# Patient Record
Sex: Female | Born: 1970 | Race: White | Hispanic: No | Marital: Married | Smoking: Never smoker
Health system: Southern US, Community
[De-identification: ages and names within clinical notes are randomized; demographics above are authoritative.]

## PROBLEM LIST (undated history)

## (undated) ENCOUNTER — Emergency Department (HOSPITAL_COMMUNITY): Admission: EM | Discharge status: Against Medical Advice | Payer: Self-pay | Attending: *Deleted | Admitting: *Deleted

## (undated) DIAGNOSIS — N393 Stress incontinence (female) (male): Secondary | ICD-10-CM

## (undated) DIAGNOSIS — F329 Major depressive disorder, single episode, unspecified: Secondary | ICD-10-CM

## (undated) DIAGNOSIS — R51 Headache: Secondary | ICD-10-CM

## (undated) DIAGNOSIS — F419 Anxiety disorder, unspecified: Secondary | ICD-10-CM

## (undated) DIAGNOSIS — N86 Erosion and ectropion of cervix uteri: Secondary | ICD-10-CM

## (undated) DIAGNOSIS — N62 Hypertrophy of breast: Secondary | ICD-10-CM

## (undated) DIAGNOSIS — R05 Cough: Secondary | ICD-10-CM

## (undated) DIAGNOSIS — N930 Postcoital and contact bleeding: Secondary | ICD-10-CM

## (undated) DIAGNOSIS — Z309 Encounter for contraceptive management, unspecified: Secondary | ICD-10-CM

## (undated) DIAGNOSIS — M5430 Sciatica, unspecified side: Secondary | ICD-10-CM

## (undated) DIAGNOSIS — Z9889 Other specified postprocedural states: Secondary | ICD-10-CM

## (undated) DIAGNOSIS — F32A Depression, unspecified: Secondary | ICD-10-CM

## (undated) DIAGNOSIS — N63 Unspecified lump in unspecified breast: Principal | ICD-10-CM

## (undated) DIAGNOSIS — R87629 Unspecified abnormal cytological findings in specimens from vagina: Secondary | ICD-10-CM

## (undated) DIAGNOSIS — I10 Essential (primary) hypertension: Secondary | ICD-10-CM

## (undated) DIAGNOSIS — Z9109 Other allergy status, other than to drugs and biological substances: Secondary | ICD-10-CM

## (undated) HISTORY — DX: Other allergy status, other than to drugs and biological substances: Z91.09

## (undated) HISTORY — DX: Cough: R05

## (undated) HISTORY — DX: Essential (primary) hypertension: I10

## (undated) HISTORY — DX: Unspecified lump in unspecified breast: N63.0

## (undated) HISTORY — DX: Postcoital and contact bleeding: N93.0

## (undated) HISTORY — DX: Erosion and ectropion of cervix uteri: N86

## (undated) HISTORY — PX: CHOLECYSTECTOMY: SHX55

## (undated) HISTORY — DX: Stress incontinence (female) (male): N39.3

## (undated) HISTORY — DX: Unspecified abnormal cytological findings in specimens from vagina: R87.629

## (undated) HISTORY — DX: Encounter for contraceptive management, unspecified: Z30.9

---

## 2001-05-18 ENCOUNTER — Emergency Department (HOSPITAL_COMMUNITY): Admission: EM | Admit: 2001-05-18 | Discharge: 2001-05-18 | Payer: Self-pay | Admitting: Internal Medicine

## 2001-07-18 ENCOUNTER — Ambulatory Visit (HOSPITAL_COMMUNITY): Admission: RE | Admit: 2001-07-18 | Discharge: 2001-07-18 | Payer: Self-pay | Admitting: Obstetrics and Gynecology

## 2001-07-18 ENCOUNTER — Encounter: Payer: Self-pay | Admitting: Obstetrics and Gynecology

## 2001-07-18 ENCOUNTER — Other Ambulatory Visit: Admission: RE | Admit: 2001-07-18 | Discharge: 2001-07-18 | Payer: Self-pay | Admitting: Obstetrics and Gynecology

## 2002-01-05 ENCOUNTER — Emergency Department (HOSPITAL_COMMUNITY): Admission: EM | Admit: 2002-01-05 | Discharge: 2002-01-06 | Payer: Self-pay

## 2002-05-04 ENCOUNTER — Ambulatory Visit (HOSPITAL_COMMUNITY): Admission: RE | Admit: 2002-05-04 | Discharge: 2002-05-04 | Payer: Self-pay | Admitting: Pulmonary Disease

## 2002-06-24 ENCOUNTER — Encounter: Admission: RE | Admit: 2002-06-24 | Discharge: 2002-07-24 | Payer: Self-pay

## 2002-11-27 ENCOUNTER — Ambulatory Visit (HOSPITAL_COMMUNITY): Admission: RE | Admit: 2002-11-27 | Discharge: 2002-11-27 | Payer: Self-pay | Admitting: Internal Medicine

## 2002-11-27 ENCOUNTER — Encounter (INDEPENDENT_AMBULATORY_CARE_PROVIDER_SITE_OTHER): Payer: Self-pay | Admitting: Internal Medicine

## 2002-12-07 ENCOUNTER — Encounter (HOSPITAL_COMMUNITY): Admission: RE | Admit: 2002-12-07 | Discharge: 2003-01-06 | Payer: Self-pay | Admitting: Internal Medicine

## 2002-12-07 ENCOUNTER — Encounter (INDEPENDENT_AMBULATORY_CARE_PROVIDER_SITE_OTHER): Payer: Self-pay | Admitting: Internal Medicine

## 2003-11-13 ENCOUNTER — Emergency Department (HOSPITAL_COMMUNITY): Admission: EM | Admit: 2003-11-13 | Discharge: 2003-11-13 | Payer: Self-pay | Admitting: Emergency Medicine

## 2004-02-24 ENCOUNTER — Ambulatory Visit (HOSPITAL_COMMUNITY): Admission: RE | Admit: 2004-02-24 | Discharge: 2004-02-24 | Payer: Self-pay | Admitting: Obstetrics and Gynecology

## 2004-02-24 ENCOUNTER — Encounter: Payer: Self-pay | Admitting: Orthopedic Surgery

## 2004-09-20 ENCOUNTER — Emergency Department (HOSPITAL_COMMUNITY): Admission: EM | Admit: 2004-09-20 | Discharge: 2004-09-20 | Payer: Self-pay | Admitting: Emergency Medicine

## 2005-04-11 ENCOUNTER — Ambulatory Visit (HOSPITAL_COMMUNITY): Admission: RE | Admit: 2005-04-11 | Discharge: 2005-04-11 | Payer: Self-pay | Admitting: Obstetrics and Gynecology

## 2006-12-20 ENCOUNTER — Encounter: Admission: RE | Admit: 2006-12-20 | Discharge: 2006-12-20 | Payer: Self-pay | Admitting: Obstetrics and Gynecology

## 2007-05-26 ENCOUNTER — Other Ambulatory Visit: Admission: RE | Admit: 2007-05-26 | Discharge: 2007-05-26 | Payer: Self-pay | Admitting: Obstetrics and Gynecology

## 2007-05-30 ENCOUNTER — Ambulatory Visit (HOSPITAL_COMMUNITY): Admission: RE | Admit: 2007-05-30 | Discharge: 2007-05-30 | Payer: Self-pay | Admitting: Obstetrics & Gynecology

## 2008-07-24 ENCOUNTER — Emergency Department (HOSPITAL_COMMUNITY): Admission: EM | Admit: 2008-07-24 | Discharge: 2008-07-24 | Payer: Self-pay | Admitting: Emergency Medicine

## 2008-12-09 ENCOUNTER — Other Ambulatory Visit: Admission: RE | Admit: 2008-12-09 | Discharge: 2008-12-09 | Payer: Self-pay | Admitting: Obstetrics and Gynecology

## 2010-01-27 ENCOUNTER — Other Ambulatory Visit: Admission: RE | Admit: 2010-01-27 | Discharge: 2010-01-27 | Payer: Self-pay | Admitting: Obstetrics and Gynecology

## 2010-02-01 ENCOUNTER — Ambulatory Visit (HOSPITAL_COMMUNITY): Admission: RE | Admit: 2010-02-01 | Discharge: 2010-02-01 | Payer: Self-pay | Admitting: Obstetrics & Gynecology

## 2010-03-15 ENCOUNTER — Ambulatory Visit: Payer: Self-pay | Admitting: Orthopedic Surgery

## 2010-03-15 DIAGNOSIS — M5126 Other intervertebral disc displacement, lumbar region: Secondary | ICD-10-CM | POA: Insufficient documentation

## 2010-03-15 DIAGNOSIS — M25559 Pain in unspecified hip: Secondary | ICD-10-CM | POA: Insufficient documentation

## 2010-03-17 ENCOUNTER — Encounter (INDEPENDENT_AMBULATORY_CARE_PROVIDER_SITE_OTHER): Payer: Self-pay | Admitting: *Deleted

## 2010-03-20 ENCOUNTER — Ambulatory Visit (HOSPITAL_COMMUNITY): Admission: RE | Admit: 2010-03-20 | Discharge: 2010-03-20 | Payer: Self-pay | Admitting: Orthopedic Surgery

## 2010-03-28 ENCOUNTER — Encounter (INDEPENDENT_AMBULATORY_CARE_PROVIDER_SITE_OTHER): Payer: Self-pay | Admitting: *Deleted

## 2010-03-28 DIAGNOSIS — IMO0002 Reserved for concepts with insufficient information to code with codable children: Secondary | ICD-10-CM | POA: Insufficient documentation

## 2010-03-31 ENCOUNTER — Telehealth: Payer: Self-pay | Admitting: Orthopedic Surgery

## 2010-04-04 ENCOUNTER — Encounter: Payer: Self-pay | Admitting: Orthopedic Surgery

## 2010-04-17 ENCOUNTER — Ambulatory Visit: Payer: Self-pay | Admitting: Orthopedic Surgery

## 2010-04-17 DIAGNOSIS — G5790 Unspecified mononeuropathy of unspecified lower limb: Secondary | ICD-10-CM | POA: Insufficient documentation

## 2010-04-22 ENCOUNTER — Emergency Department (HOSPITAL_COMMUNITY): Admission: EM | Admit: 2010-04-22 | Discharge: 2010-04-22 | Payer: Self-pay | Admitting: Emergency Medicine

## 2010-04-28 ENCOUNTER — Ambulatory Visit (HOSPITAL_COMMUNITY): Admission: RE | Admit: 2010-04-28 | Discharge: 2010-04-29 | Payer: Self-pay | Admitting: Orthopaedic Surgery

## 2010-04-28 HISTORY — PX: ORIF DISTAL RADIUS FRACTURE: SUR927

## 2010-05-31 ENCOUNTER — Ambulatory Visit: Payer: Self-pay | Admitting: Orthopedic Surgery

## 2010-06-12 ENCOUNTER — Encounter (HOSPITAL_COMMUNITY): Admission: RE | Admit: 2010-06-12 | Discharge: 2010-07-12 | Payer: Self-pay | Admitting: Orthopaedic Surgery

## 2010-07-17 ENCOUNTER — Encounter (HOSPITAL_COMMUNITY)
Admission: RE | Admit: 2010-07-17 | Discharge: 2010-08-16 | Payer: Self-pay | Source: Home / Self Care | Admitting: Orthopaedic Surgery

## 2010-08-30 ENCOUNTER — Ambulatory Visit: Payer: Self-pay | Admitting: Orthopedic Surgery

## 2010-09-05 ENCOUNTER — Encounter: Payer: Self-pay | Admitting: Orthopedic Surgery

## 2010-09-19 ENCOUNTER — Ambulatory Visit
Admission: RE | Admit: 2010-09-19 | Discharge: 2010-09-19 | Payer: Self-pay | Source: Home / Self Care | Attending: Orthopedic Surgery | Admitting: Orthopedic Surgery

## 2010-10-10 NOTE — Miscellaneous (Signed)
Summary: ncs lowers root impingement order  Clinical Lists Changes  Problems: Added new problem of THORACIC/LUMBOSACRAL NEURITIS/RADICULITIS UNSPEC (ICD-724.4) Orders: Added new Referral order of Neurology Referral (Neuro) - Signed

## 2010-10-10 NOTE — Assessment & Plan Note (Signed)
Summary: ncs results scanned in chart/medcost/bsf   Visit Type:  Follow-up Referring Santiel Topper:  Pa Cyril Mourning, [facility] Primary Natalin Bible:  Dr. Juanetta Gosling  CC:  back pain.  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  She continues to have some discomfort in her back when she sits for a long time it seems to get better over the weekend when she is at home.  She did take a steroid Dosepak which almost gave her 100% relief.  She is also on Robaxin.  She is here today to discuss her nerve study findings and, with a plan for her treatment  She tells me that her symptoms are almost 100% better she still has a little bit of discomfort when sitting.    Meds: Effexor, Topamax, Imitrex, Aleve, Ibuprofen, naprosyn OTC some relief not much.  after review of the study I think she has the superficial peroneal nerve palsy most likely from something going on in her back despite the negative MRI I do not think the palsy or root impingement is at the knee area  Recommend gabapentin 100 mg at night for 6 weeks followup 6 weeks she is to call me if her symptoms start to come back so we can reduce the prednisone and increase the Neurontin     Allergies: No Known Drug Allergies  Past History:  Past Medical History: Last updated: 03/15/2010 MIGRAINES depression  Review of Systems Musculoskeletal:  back pain slightly to moderately improved.   Impression & Recommendations:  Problem # 1:  MONONEURITIS, LEG (ICD-355.8) Assessment Comment Only  superficial peroneal nerve LEFT shows disease and absent flow in the sensory mode  Orders: Est. Patient Level III (19147)  Medications Added to Medication List This Visit: 1)  Gabapentin 100 Mg Caps (Gabapentin) .Marland Kitchen.. 1 by mouth at  bedtime  Patient Instructions: 1)  start neurontin 100mg  at night x 6 weeks  2)  re-check in 6 weeks  Prescriptions: GABAPENTIN 100 MG CAPS (GABAPENTIN) 1 by mouth at bedtime  #42 x 1   Entered and Authorized by:   Fuller Canada MD   Signed by:   Fuller Canada MD on 04/17/2010   Method used:   Faxed to ...       [facility] [address] [phone]* (retail)       [address], [place]  [address]       Ph: [phone]       Fax: [phone]   RxID:   [id number]

## 2010-10-10 NOTE — Letter (Signed)
Summary: History form  History form   Imported By: Jacklynn Ganong 03/22/2010 11:23:10  _____________________________________________________________________  External Attachment:    Type:   Image     Comment:   External Document

## 2010-10-10 NOTE — Assessment & Plan Note (Signed)
Summary: EVAL/TREAT HIP PAIN LT>RT/NEEDS XRAYS/REF J.GRIFFIN/MEDCOST/CAF   Vital Signs:  Patient profile:   40 year old female Height:      62 inches Weight:      140 pounds Pulse rate:   64 / minute Resp:     16 per minute  Vitals Entered By: Fuller Canada MD (March 15, 2010 3:53 PM)  Visit Type:  new patient Referring Provider:  Pa Cyril Cox, [facility] Primary Provider:  Dr. Juanetta Cox  CC:  bilateral leg pain.  History of Present Illness: I saw Erica Cox in the office today for an initial visit.  She is a 40 years old woman with the complaint of:  bilateral leg pain, mostly back of left leg thigh area.  FYI 2005 MRI T and L spine for review.  Xrays today in our office.  Meds: Effexor, Topamax, Imitrex, Aleve, Ibuprofen, naprosyn OTC some relief not much.   Our patient today complains of bilateral leg pain LEFT greater than RIGHT with pain in the backs of her legs radiating from her back down her thighs.  She is having a dull throbbing burning sensation which reaches a level of 10 and it is constant.  It is worse when she is sitting in awkward positions and is better with ice and massage.  The pain occurs whether she sitting standing or walking came on gradually over the last few years and she does report some numbness tingling catching.  She did have an MRI in 2005 and it showed that she had normal lumbar and thoracic spine.  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 2 yrs of college.  Review of Systems Constitutional:   Complains of weight gain; denies weight loss, fever, chills, and fatigue. Cardiovascular:  Complains of chest pain; denies palpitations, fainting, and murmurs. Neurologic:  Complains of numbness and tingling; denies unsteady gait, dizziness, tremors, and seizure. Musculoskeletal:  Complains of joint pain, stiffness, and muscle pain; denies swelling, instability, redness, and heat.  The review of systems is negative for Respiratory, Gastrointestinal, Genitourinary, Endocrine, Psychiatric, Skin, HEENT, Immunology, and Hemoatologic.  Physical Exam  Msk:  The patient is well developed and nourished, with normal grooming and hygiene. The body habitus is  normal Pulses:  pulses normal in all 4 extremities Extremities:  no clubbing, cyanosis, edema, or deformity noted with normal full range of motion of all joints Neurologic:  The coordination and The reflexes were normal    When comparing sharp touch RIGHT and LEFT, the LEFT L5 dermatome had decreased sensation Skin:  intact without lesions or rashes Inguinal Nodes:  no significant adenopathy Psych:  alert and cooperative; normal mood and affect; normal attention span and concentration Additional Exam:  lumbar spine  His normal flexion of her lumbar spine shows normal extension rotation and lateral bend however she has tenderness in the SI joints and at the L5-S1 midline interspace  She has negative straight leg raise on the RIGHT buttock positive one on the LEFT at 45  She has also abnormal spinal contour in the sagittal plane with a RIGHT superior  ilium with normal leg length measuring 33 inches from ASIS with abnormal contour in the coronal plane as well.     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

## 2010-10-10 NOTE — Assessment & Plan Note (Signed)
Summary: 6 WK RE-CK/RESP TO MED/UMR/CAF   Visit Type:  Follow-up Referring Provider:  Pa Cyril Mourning, [facility] Primary Provider:  Dr. Juanetta Gosling  CC:  recheck in leg pain.  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   She is on Neurontin 100 mg at night or once a day  She is doing much better.  This is her 6 week recheck.No pain  She has also had a LEFT distal radius open treatment internal fixation for a severe fracture was major displacement.  That was done in [place].  She will follow there for that.    Allergies: No Known Drug Allergies   Impression & Recommendations:  Problem # 1:  MONONEURITIS, LEG (ICD-355.8)  continue Neurontin follow up in December for recheck  Orders: Est. Patient Level II (04540)  Patient Instructions: 1)  Stay on 1 neurontin at night  2)  return in December  Prescriptions: GABAPENTIN 100 MG CAPS (GABAPENTIN) 1 by mouth at bedtime  #90 x 0   Entered and Authorized by:   Fuller Canada MD   Signed by:   Fuller Canada MD on 05/31/2010   Method used:   Faxed to ...       [facility] [address] [phone]* (retail)       [address], [place]  [address]       Ph: [phone]       Fax: [phone]   RxID:   [id number]

## 2010-10-10 NOTE — Medication Information (Signed)
Summary: RX Folder  RX Folder   Imported By: Cammie Sickle 03/15/2010 18:16:06  _____________________________________________________________________  External Attachment:    Type:   Image     Comment:   External Document

## 2010-10-10 NOTE — Progress Notes (Signed)
Summary: Call to patient Referral appointment Dr Gerilyn Pilgrim  Phone Note Outgoing Call   Call placed to: Patient Summary of Call: I called patient, left msg at work# [phone] to notify of referral appointment rec'd in fax from Dr Gerilyn Pilgrim for [facility] - 04/04/10 at 10:30am Initial call taken by: Cammie Sickle,  March 31, 2010 11:20 AM  Follow-up for Phone Call        Patient ret'd call - she's already had the appointment w/Dr Gerilyn Pilgrim. Report to follow. Follow-up by: Cammie Sickle,  April 03, 2010 5:32 PM

## 2010-10-10 NOTE — Miscellaneous (Signed)
Summary: faxed ncs order to [place]  Clinical Lists Changes 

## 2010-10-10 NOTE — Miscellaneous (Signed)
Summary: mri L spine 03/20/10 [facility] 430pm  Clinical Lists Changes  o precert needed for [facility] preferred, [facility], to get results called to her.

## 2010-10-12 NOTE — Letter (Signed)
Summary: *Orthopedic No Show Letter  [facility]  [address]  [place], [place] [address]   Phone: [phone]  Fax: [phone]     09/05/2010  Erica Cox [address] [place], [place]  [address]    Dear Erica Cox,   Our records indicate that you missed your scheduled appointment with Dr. Beaulah Corin on 08/30/10.  Please contact this office to reschedule your appointment as soon as possible.  It is important that you keep your scheduled appointments with your physician, so we can provide you the best care possible.        Sincerely,    Dr. Terrance Mass, MD [facility] Phone [phone]

## 2010-10-12 NOTE — Assessment & Plan Note (Signed)
Summary: 3 MTH RECK/RESP MED/[facility]ST/[facility]   Visit Type:  Follow-up Referring Provider:  Pa Cyril Mourning, [facility] Primary Provider:  Dr. Juanetta Gosling  CC:  RECHECK HIP AND BACK.  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  She is on Neurontin 100 mg at night or once a day  she was last seen 3 months ago. She is doing well as long as she takes 100 mg of Neurontin at night.  On examination, she has negative straight leg raises bilaterally. There is no tenderness in the proximal portion of her LEFT leg, just around the fibular head and somewhat in the lateral portion of the calf.    Allergies: No Known Drug Allergies   Impression & Recommendations:  Problem # 1:  MONONEURITIS, LEG (ICD-355.8) Assessment Improved  Orders: Est. Patient Level II (16109)  Patient Instructions: 1)  Continue 100 mg neurontin hs  2)  f/u 6 months if needed   Orders Added: 1)  Est. Patient Level II [60454]

## 2010-11-24 LAB — PROTIME-INR
INR: 0.99 (ref 0.00–1.49)
Prothrombin Time: 13.3 seconds (ref 11.6–15.2)

## 2010-11-24 LAB — SURGICAL PCR SCREEN: MRSA, PCR: NEGATIVE

## 2011-03-20 DIAGNOSIS — Z5309 Procedure and treatment not carried out because of other contraindication: Secondary | ICD-10-CM | POA: Insufficient documentation

## 2011-03-26 DIAGNOSIS — Z5320 Procedure and treatment not carried out because of patient's decision for unspecified reasons: Secondary | ICD-10-CM | POA: Insufficient documentation

## 2011-07-01 ENCOUNTER — Encounter: Payer: Self-pay | Admitting: Emergency Medicine

## 2011-07-01 ENCOUNTER — Emergency Department (HOSPITAL_COMMUNITY)
Admission: EM | Admit: 2011-07-01 | Discharge: 2011-07-01 | Disposition: A | Discharge status: Home / Self Care | Payer: PRIVATE HEALTH INSURANCE | Attending: Emergency Medicine | Admitting: Emergency Medicine

## 2011-07-01 DIAGNOSIS — G43909 Migraine, unspecified, not intractable, without status migrainosus: Secondary | ICD-10-CM

## 2011-07-01 MED ORDER — SODIUM CHLORIDE 0.9 % IV BOLUS (SEPSIS)
1000.0000 mL | Freq: Once | INTRAVENOUS | Status: AC
  Administered 2011-07-01: 1000 mL via INTRAVENOUS

## 2011-07-01 MED ORDER — KETOROLAC TROMETHAMINE 30 MG/ML IJ SOLN
30.0000 mg | Freq: Once | INTRAMUSCULAR | Status: AC
  Administered 2011-07-01: 30 mg via INTRAVENOUS
  Filled 2011-07-01: qty 1

## 2011-07-01 MED ORDER — METOCLOPRAMIDE HCL 5 MG/ML IJ SOLN
10.0000 mg | Freq: Once | INTRAMUSCULAR | Status: AC
  Administered 2011-07-01: 10 mg via INTRAVENOUS
  Filled 2011-07-01: qty 2

## 2011-07-01 MED ORDER — DIPHENHYDRAMINE HCL 50 MG/ML IJ SOLN
25.0000 mg | Freq: Once | INTRAMUSCULAR | Status: AC
  Administered 2011-07-01: 18:00:00 via INTRAVENOUS
  Filled 2011-07-01: qty 4

## 2011-07-01 MED ORDER — SUMATRIPTAN-NAPROXEN SODIUM 85-500 MG PO TABS: 1.0000 | ORAL_TABLET | ORAL | Status: DC | PRN

## 2011-07-01 NOTE — ED Notes (Signed)
Patient c/o migraine x4 days with nausea. Sensitivity to light and sound. Per patient second migraine this month.

## 2011-07-01 NOTE — ED Provider Notes (Signed)
Scribed for Donnetta Hutching, MD, the patient was seen in room APA07/APA07 . This chart was scribed by Ellie Lunch.   CSN: [id number] Arrival date & time: 07/01/2011  4:30 PM   First MD Initiated Contact with Patient 07/01/11 1630      Chief Complaint  Patient presents with  . Migraine    (Consider location/radiation/quality/duration/timing/severity/associated sxs/prior treatment) HPI Erica Cox is a 40 y.o. female with a history of migraines who presents to the Emergency Department complaining of migraine for 4 days with associated nausea and photophobia. Pain was originally located in left periorbital but has migrated to right periorbital and now center forehead. Pain is made worse by light and sound. Pain is described as severe, constant, throbbing pain. Pt reports she saw Dr Mat Carne her neurologist on Friday and was given chlorpromazine which resolved her HA. HA returned yesterday on right periorbital.  Pt took 3 Chlorpromazine 25mg  today with no improvement. HA is typical of her past migraines. Pt says usually takes Imitrex with resolution but reports that she has run out of her allotted monthly quantity. (limited by insurance) Pt also took 2 prednisone today with no improvement.    Past Medical History  Diagnosis Date  . Migraines     Past Surgical History  Procedure Date  . Cholecystectomy   . Wrist surgery     left    Family History  Problem Relation Age of Onset  . Diabetes Father     History  Substance Use Topics  . Smoking status: Never Smoker   . Smokeless tobacco: Never Used  . Alcohol Use: No  accompanied to ED by father  Review of Systems 10 Systems reviewed and are negative for acute change except as noted in the HPI.  Allergies  Review of patient's allergies indicates no known allergies.  Home Medications  No current outpatient prescriptions on file.  BP 134/90  Pulse 95  Temp(Src) 98.2 F (36.8 C) (Oral)  Resp 16  Ht 5\' 8"  (1.727 m)  Wt 145 lb  (65.772 kg)  BMI 22.05 kg/m2  SpO2 100%  LMP 07/01/2011  Physical Exam  Nursing note and vitals reviewed. Constitutional: She is oriented to person, place, and time. She appears well-developed and well-nourished.       Appears uncomfortable  HENT:  Head: Normocephalic and atraumatic.  Eyes: Conjunctivae and EOM are normal.  Neck: Neck supple.  Cardiovascular: Normal rate, regular rhythm and normal heart sounds.   Pulmonary/Chest: Effort normal and breath sounds normal.  Abdominal: Soft. She exhibits no distension. There is no tenderness.  Musculoskeletal: Normal range of motion.  Neurological: She is alert and oriented to person, place, and time.  Skin: Skin is warm and dry.  Psychiatric: She has a normal mood and affect. Her behavior is normal.    ED Course  Procedures (including critical care time) OTHER DATA REVIEWED: Nursing notes, vital signs, and past medical records reviewed.   DIAGNOSTIC STUDIES: Oxygen Saturation is 100% on room air, normal by my interpretation.    No diagnosis found.  5:06 PM EDP at PT bedside. Discussed plan to treat migraine IV fluids, Benadryl, Reglan, and Toradol.  19:34 Pt recheck. Pt reports feeling improved and ready to go home. Plan to dischrage.   MDM  Patient feeling better after IV fluids and medication. No neuro deficit   I personally performed the services described in this documentation, which was scribed in my presence. The recorded information has been reviewed and considered.  Donnetta Hutching, MD 07/01/11 1902

## 2011-07-03 ENCOUNTER — Other Ambulatory Visit: Payer: Self-pay | Admitting: *Deleted

## 2011-07-03 MED ORDER — GABAPENTIN 100 MG PO CAPS: 100.0000 mg | ORAL_CAPSULE | Freq: Every day | ORAL | Status: DC

## 2011-08-20 ENCOUNTER — Other Ambulatory Visit: Payer: Self-pay | Admitting: Obstetrics and Gynecology

## 2011-08-20 DIAGNOSIS — Z139 Encounter for screening, unspecified: Secondary | ICD-10-CM

## 2011-08-21 ENCOUNTER — Encounter (HOSPITAL_BASED_OUTPATIENT_CLINIC_OR_DEPARTMENT_OTHER): Payer: Self-pay | Admitting: *Deleted

## 2011-08-23 ENCOUNTER — Ambulatory Visit (HOSPITAL_COMMUNITY)
Admission: RE | Admit: 2011-08-23 | Discharge: 2011-08-23 | Disposition: A | Discharge status: Home / Self Care | Payer: PRIVATE HEALTH INSURANCE | Source: Ambulatory Visit | Attending: Obstetrics and Gynecology | Admitting: Obstetrics and Gynecology

## 2011-08-23 DIAGNOSIS — Z139 Encounter for screening, unspecified: Secondary | ICD-10-CM

## 2011-08-23 DIAGNOSIS — Z1231 Encounter for screening mammogram for malignant neoplasm of breast: Secondary | ICD-10-CM | POA: Insufficient documentation

## 2011-08-28 ENCOUNTER — Other Ambulatory Visit: Payer: Self-pay | Admitting: Plastic Surgery

## 2011-08-28 ENCOUNTER — Encounter (HOSPITAL_BASED_OUTPATIENT_CLINIC_OR_DEPARTMENT_OTHER): Payer: Self-pay | Admitting: Anesthesiology

## 2011-08-28 ENCOUNTER — Encounter (HOSPITAL_BASED_OUTPATIENT_CLINIC_OR_DEPARTMENT_OTHER)
Admission: RE | Disposition: A | Discharge status: Home / Self Care | Payer: Self-pay | Source: Ambulatory Visit | Attending: Plastic Surgery

## 2011-08-28 ENCOUNTER — Ambulatory Visit (HOSPITAL_BASED_OUTPATIENT_CLINIC_OR_DEPARTMENT_OTHER)
Admission: RE | Admit: 2011-08-28 | Discharge: 2011-08-28 | Disposition: A | Discharge status: Home / Self Care | Payer: PRIVATE HEALTH INSURANCE | Source: Ambulatory Visit | Attending: Plastic Surgery | Admitting: Plastic Surgery

## 2011-08-28 ENCOUNTER — Encounter (HOSPITAL_BASED_OUTPATIENT_CLINIC_OR_DEPARTMENT_OTHER): Payer: Self-pay

## 2011-08-28 ENCOUNTER — Ambulatory Visit (HOSPITAL_BASED_OUTPATIENT_CLINIC_OR_DEPARTMENT_OTHER): Payer: PRIVATE HEALTH INSURANCE | Admitting: Anesthesiology

## 2011-08-28 DIAGNOSIS — N62 Hypertrophy of breast: Secondary | ICD-10-CM | POA: Insufficient documentation

## 2011-08-28 HISTORY — DX: Anxiety disorder, unspecified: F41.9

## 2011-08-28 HISTORY — DX: Major depressive disorder, single episode, unspecified: F32.9

## 2011-08-28 HISTORY — DX: Hypertrophy of breast: N62

## 2011-08-28 HISTORY — DX: Headache: R51

## 2011-08-28 HISTORY — DX: Depression, unspecified: F32.A

## 2011-08-28 HISTORY — PX: BREAST REDUCTION SURGERY: SHX8

## 2011-08-28 SURGERY — MAMMOPLASTY, REDUCTION
Anesthesia: General | Site: Breast | Laterality: Bilateral | Wound class: Clean

## 2011-08-28 MED ORDER — DEXAMETHASONE SODIUM PHOSPHATE 4 MG/ML IJ SOLN
INTRAMUSCULAR | Status: DC | PRN
  Administered 2011-08-28: 10 mg via INTRAVENOUS

## 2011-08-28 MED ORDER — PROMETHAZINE HCL 25 MG/ML IJ SOLN: 6.2500 mg | INTRAMUSCULAR | Status: DC | PRN

## 2011-08-28 MED ORDER — FENTANYL CITRATE 0.05 MG/ML IJ SOLN
INTRAMUSCULAR | Status: DC | PRN
  Administered 2011-08-28 (×4): 50 ug via INTRAVENOUS
  Administered 2011-08-28: 100 ug via INTRAVENOUS
  Administered 2011-08-28 (×4): 50 ug via INTRAVENOUS

## 2011-08-28 MED ORDER — BACITRACIN ZINC 500 UNIT/GM EX OINT
TOPICAL_OINTMENT | CUTANEOUS | Status: DC | PRN
  Administered 2011-08-28: 1 via TOPICAL

## 2011-08-28 MED ORDER — MEPERIDINE HCL 25 MG/ML IJ SOLN: 6.2500 mg | INTRAMUSCULAR | Status: DC | PRN

## 2011-08-28 MED ORDER — HYDROMORPHONE HCL PF 1 MG/ML IJ SOLN
0.2500 mg | INTRAMUSCULAR | Status: DC | PRN
  Administered 2011-08-28: 0.5 mg via INTRAVENOUS

## 2011-08-28 MED ORDER — CEFAZOLIN SODIUM 1-5 GM-% IV SOLN
1.0000 g | Freq: Once | INTRAVENOUS | Status: AC
  Administered 2011-08-28: 1 g via INTRAVENOUS

## 2011-08-28 MED ORDER — DROPERIDOL 2.5 MG/ML IJ SOLN
INTRAMUSCULAR | Status: DC | PRN
  Administered 2011-08-28: 0.625 mg via INTRAVENOUS

## 2011-08-28 MED ORDER — BUPIVACAINE-EPINEPHRINE 0.5% -1:200000 IJ SOLN
INTRAMUSCULAR | Status: DC | PRN
  Administered 2011-08-28: 30 mL

## 2011-08-28 MED ORDER — PROPOFOL 10 MG/ML IV EMUL
INTRAVENOUS | Status: DC | PRN
  Administered 2011-08-28: 150 mg via INTRAVENOUS

## 2011-08-28 MED ORDER — MIDAZOLAM HCL 5 MG/5ML IJ SOLN
INTRAMUSCULAR | Status: DC | PRN
  Administered 2011-08-28: 2 mg via INTRAVENOUS

## 2011-08-28 MED ORDER — LIDOCAINE-EPINEPHRINE 1 %-1:100000 IJ SOLN
INTRAMUSCULAR | Status: DC | PRN
  Administered 2011-08-28: 20 mL

## 2011-08-28 MED ORDER — ONDANSETRON HCL 4 MG/2ML IJ SOLN
INTRAMUSCULAR | Status: DC | PRN
  Administered 2011-08-28: 4 mg via INTRAVENOUS

## 2011-08-28 MED ORDER — LIDOCAINE HCL (CARDIAC) 10 MG/ML IV SOLN
INTRAVENOUS | Status: DC | PRN
  Administered 2011-08-28: 60 mg via INTRAVENOUS

## 2011-08-28 MED ORDER — CISATRACURIUM BESYLATE 2 MG/ML IV SOLN
INTRAVENOUS | Status: DC | PRN
  Administered 2011-08-28: 12 mg via INTRAVENOUS

## 2011-08-28 MED ORDER — LACTATED RINGERS IV SOLN
INTRAVENOUS | Status: DC
  Administered 2011-08-28 (×3): via INTRAVENOUS

## 2011-08-28 SURGICAL SUPPLY — 66 items
APL SKNCLS STERI-STRIP NONHPOA (GAUZE/BANDAGES/DRESSINGS) ×1
BANDAGE GAUZE ELAST BULKY 4 IN (GAUZE/BANDAGES/DRESSINGS) ×4 IMPLANT
BENZOIN TINCTURE PRP APPL 2/3 (GAUZE/BANDAGES/DRESSINGS) ×3 IMPLANT
BLADE KNIFE PERSONA 10 (BLADE) ×8 IMPLANT
BLADE KNIFE PERSONA 15 (BLADE) ×6 IMPLANT
CANISTER SUCTION 1200CC (MISCELLANEOUS) ×2 IMPLANT
CAP BOUFFANT 24 BLUE NURSES (PROTECTIVE WEAR) ×2 IMPLANT
CLOSURE STERI STRIP 1/2 X4 (GAUZE/BANDAGES/DRESSINGS) ×1 IMPLANT
CLOTH BEACON ORANGE TIMEOUT ST (SAFETY) ×2 IMPLANT
COVER MAYO STAND STRL (DRAPES) ×2 IMPLANT
COVER TABLE BACK 60X90 (DRAPES) ×2 IMPLANT
DECANTER SPIKE VIAL GLASS SM (MISCELLANEOUS) ×2 IMPLANT
DRAIN CHANNEL 10F 3/8 F FF (DRAIN) ×5 IMPLANT
DRAPE LAPAROSCOPIC ABDOMINAL (DRAPES) ×2 IMPLANT
DRSG EMULSION OIL 3X3 NADH (GAUZE/BANDAGES/DRESSINGS) ×3 IMPLANT
DRSG PAD ABDOMINAL 8X10 ST (GAUZE/BANDAGES/DRESSINGS) ×3 IMPLANT
ELECT NDL TIP 2.8 STRL (NEEDLE) IMPLANT
ELECT NEEDLE TIP 2.8 STRL (NEEDLE) IMPLANT
ELECT REM PT RETURN 9FT ADLT (ELECTROSURGICAL) ×2
ELECTRODE REM PT RTRN 9FT ADLT (ELECTROSURGICAL) ×1 IMPLANT
EVACUATOR SILICONE 100CC (DRAIN) ×4 IMPLANT
FILTER 7/8 IN (FILTER) ×2 IMPLANT
GLOVE BIO SURGEON STRL SZ 6.5 (GLOVE) ×3 IMPLANT
GLOVE BIOGEL PI IND STRL 6.5 (GLOVE) IMPLANT
GLOVE BIOGEL PI IND STRL 7.0 (GLOVE) IMPLANT
GLOVE BIOGEL PI INDICATOR 6.5 (GLOVE)
GLOVE BIOGEL PI INDICATOR 7.0 (GLOVE) ×1
GLOVE SKINSENSE NS SZ6.5 (GLOVE) ×1
GLOVE SKINSENSE STRL SZ6.5 (GLOVE) IMPLANT
GOWN PREVENTION PLUS XLARGE (GOWN DISPOSABLE) ×2 IMPLANT
GOWN PREVENTION PLUS XXLARGE (GOWN DISPOSABLE) IMPLANT
NDL HYPO 25X1 1.5 SAFETY (NEEDLE) ×1 IMPLANT
NDL SPNL 18GX3.5 QUINCKE PK (NEEDLE) ×1 IMPLANT
NEEDLE HYPO 25X1 1.5 SAFETY (NEEDLE) ×2 IMPLANT
NEEDLE SPNL 18GX3.5 QUINCKE PK (NEEDLE) ×2 IMPLANT
NS IRRIG 1000ML POUR BTL (IV SOLUTION) ×4 IMPLANT
PACK BASIN DAY SURGERY FS (CUSTOM PROCEDURE TRAY) ×2 IMPLANT
PIN SAFETY STERILE (MISCELLANEOUS) ×2 IMPLANT
SCRUB PCMX 4 OZ (MISCELLANEOUS) ×1 IMPLANT
SCRUB TECHNI CARE SURGICAL (MISCELLANEOUS) ×2 IMPLANT
SLEEVE SCD COMPRESS KNEE MED (MISCELLANEOUS) ×2 IMPLANT
SPECIMEN JAR MEDIUM (MISCELLANEOUS) ×3 IMPLANT
SPECIMEN JAR X LARGE (MISCELLANEOUS) ×2 IMPLANT
SPONGE GAUZE 4X4 12PLY (GAUZE/BANDAGES/DRESSINGS) ×4 IMPLANT
SPONGE GAUZE 4X4 16PLY NONSTR (GAUZE/BANDAGES/DRESSINGS) ×1 IMPLANT
SPONGE LAP 18X18 X RAY DECT (DISPOSABLE) ×7 IMPLANT
STAPLER VISISTAT 35W (STAPLE) ×4 IMPLANT
STRIP CLOSURE SKIN 1/2X4 (GAUZE/BANDAGES/DRESSINGS) ×8 IMPLANT
SUT ETHILON 3 0 PS 1 (SUTURE) ×3 IMPLANT
SUT MNCRL AB 3-0 PS2 18 (SUTURE) ×8 IMPLANT
SUT MNCRL AB 4-0 PS2 18 (SUTURE) ×4 IMPLANT
SUT MON AB 5-0 PS2 18 (SUTURE) ×4 IMPLANT
SUT PROLENE 2 0 CT2 30 (SUTURE) ×2 IMPLANT
SUT PROLENE 3 0 PS 1 (SUTURE) ×4 IMPLANT
SUT QUILL PDO 2-0 (SUTURE) ×4 IMPLANT
SYR BULB IRRIGATION 50ML (SYRINGE) ×4 IMPLANT
SYR CONTROL 10ML LL (SYRINGE) ×5 IMPLANT
TOWEL OR 17X24 6PK STRL BLUE (TOWEL DISPOSABLE) ×6 IMPLANT
TOWEL OR NON WOVEN STRL DISP B (DISPOSABLE) ×2 IMPLANT
TRAY DSU PREP LF (CUSTOM PROCEDURE TRAY) ×2 IMPLANT
TRAY FOLEY CATH 14FR (SET/KITS/TRAYS/PACK) ×2 IMPLANT
TUBE CONNECTING 20X1/4 (TUBING) ×2 IMPLANT
UNDERPAD 30X30 INCONTINENT (UNDERPADS AND DIAPERS) ×4 IMPLANT
VAC PENCILS W/TUBING CLEAR (MISCELLANEOUS) ×2 IMPLANT
WATER STERILE IRR 1000ML POUR (IV SOLUTION) ×1 IMPLANT
YANKAUER SUCT BULB TIP NO VENT (SUCTIONS) ×2 IMPLANT

## 2011-08-28 NOTE — Anesthesia Preprocedure Evaluation (Signed)
Anesthesia Evaluation  Patient identified by MRN, date of birth, ID band Patient awake    Reviewed: Allergy & Precautions, H&P , NPO status , Patient's Chart, lab work & pertinent test results  Airway Mallampati: II TM Distance: <3 FB Neck ROM: full    Dental  (+) Teeth Intact   Pulmonary neg pulmonary ROS,  clear to auscultation        Cardiovascular neg cardio ROS regular Normal    Neuro/Psych    GI/Hepatic negative GI ROS, Neg liver ROS,   Endo/Other    Renal/GU negative Renal ROS     Musculoskeletal   Abdominal Normal abdominal exam  (+)   Peds  Hematology negative hematology ROS (+)   Anesthesia Other Findings   Reproductive/Obstetrics                           Anesthesia Physical Anesthesia Plan  ASA: I  Anesthesia Plan: General ETT   Post-op Pain Management:    Induction:   Airway Management Planned:   Additional Equipment:   Intra-op Plan:   Post-operative Plan:   Informed Consent: I have reviewed the patients History and Physical, chart, labs and discussed the procedure including the risks, benefits and alternatives for the proposed anesthesia with the patient or authorized representative who has indicated his/her understanding and acceptance.   Dental Advisory Given  Plan Discussed with: CRNA and Surgeon  Anesthesia Plan Comments:         Anesthesia Quick Evaluation

## 2011-08-28 NOTE — Anesthesia Procedure Notes (Signed)
Procedure Name: Intubation Date/Time: 08/28/2011 8:00 AM Performed by: Jearld Shines Pre-anesthesia Checklist: Patient identified, Timeout performed, Emergency Drugs available, Suction available and Patient being monitored Patient Re-evaluated:Patient Re-evaluated prior to inductionOxygen Delivery Method: Circle System Utilized Preoxygenation: Pre-oxygenation with 100% oxygen Intubation Type: IV induction Ventilation: Mask ventilation without difficulty Laryngoscope Size: Mac and 3 Grade View: Grade I Tube type: Oral Tube size: 7.0 mm Number of attempts: 1 Placement Confirmation: ETT inserted through vocal cords under direct vision,  positive ETCO2 and breath sounds checked- equal and bilateral Secured at: 20 cm Tube secured with: Tape Dental Injury: Teeth and Oropharynx as per pre-operative assessment

## 2011-08-28 NOTE — Anesthesia Postprocedure Evaluation (Signed)
  Anesthesia Post-op Note  Patient: Erica Cox  Procedure(s) Performed:  MAMMARY REDUCTION BILATERAL (BREAST)  Patient Location: PACU and SICU  Anesthesia Type: General  Level of Consciousness: awake  Airway and Oxygen Therapy: Patient Spontanous Breathing  Post-op Pain: mild  Post-op Assessment: Post-op Vital signs reviewed  Post-op Vital Signs: stable  Complications: No apparent anesthesia complications

## 2011-08-28 NOTE — Brief Op Note (Signed)
08/28/2011  11:25 AM  PATIENT:  Erica Cox  40 y.o. female  PRE-OPERATIVE DIAGNOSIS:  MACROMASTIA  POST-OPERATIVE DIAGNOSIS:  same  PROCEDURE:  Procedure(s): MAMMARY REDUCTION BILATERAL (BREAST)  SURGEON:  Surgeon(s): Monic Engelmann A Danah Reinecke   ASSISTANTS: None  ANESTHESIA:   general  EBL:  Total I/O In: 2000 [I.V.:2000] Out: 400 [Urine:200; Blood:200]  DRAINS: (38F) Jackson-Pratt drain(s) with closed bulb suction in the breast   LOCAL MEDICATIONS USED: 1/2% Marcaine with epi  SPECIMEN:  Source of Specimen:  Bilateral Breasts  DISPOSITION OF SPECIMEN:  PATHOLOGY  COUNTS: CORRECT  DICTATION: .Other Dictation: Dictation Number [id number]  PLAN OF CARE: Discharge to home after PACU  PATIENT DISPOSITION:  PACU - hemodynamically stable.   Delay start of Pharmacological VTE agent (>24hrs) due to surgical blood loss or risk of bleeding: NOT APPLICABLE

## 2011-08-28 NOTE — Transfer of Care (Signed)
Immediate Anesthesia Transfer of Care Note  Patient: Erica Cox  Procedure(s) Performed:  MAMMARY REDUCTION BILATERAL (BREAST)  Patient Location: PACU  Anesthesia Type: General  Level of Consciousness: awake and sedated  Airway & Oxygen Therapy: Patient Spontanous Breathing and Patient connected to face mask oxygen  Post-op Assessment: Report given to PACU RN and Post -op Vital signs reviewed and stable  Post vital signs: Reviewed and stable  Complications: No apparent anesthesia complications

## 2011-08-29 ENCOUNTER — Other Ambulatory Visit: Payer: Self-pay | Admitting: Obstetrics and Gynecology

## 2011-08-29 DIAGNOSIS — R928 Other abnormal and inconclusive findings on diagnostic imaging of breast: Secondary | ICD-10-CM

## 2011-08-29 NOTE — Op Note (Signed)
NAME:  Erica Cox, Erica Cox                       ACCOUNT NO.:  MEDICAL RECORD NO.:  [id number]  LOCATION:                                 FACILITY:  PHYSICIAN:  Mary Contogiannis, M.D.DATE OF BIRTH:  09/24/1970  DATE OF PROCEDURE:  08/28/2011 DATE OF DISCHARGE:                              OPERATIVE REPORT   PREOPERATIVE DIAGNOSIS:  Bilateral macromastia.  POSTOPERATIVE DIAGNOSIS:  Bilateral macromastia.  PROCEDURE:  Bilateral reduction mammoplasties.  ATTENDING SURGEON:  Brantley Persons, MD  ANESTHESIA:  General.  ANESTHESIOLOGIST:  Talmadge Coventry, MD  FLUID REPLACEMENT:  2000 mL crystalloid.  URINE OUTPUT:  200 mL.  ESTIMATED BLOOD LOSS:  200 mL.  COMPLICATIONS:  None.  INDICATIONS FOR THE PROCEDURE:  The patient is a 40 year old Caucasian female who has bilateral macromastia that is clinically symptomatic. She presents to undergo bilateral reduction mammoplasties.  PROCEDURE IN DETAIL:  The patient was marked in the preop holding area in the pattern of Wise for the future bilateral reduction mammoplasties. She was then taken back to the OR placed on table in supine position. After adequate general anesthesia was obtained, the patient's chest was prepped with Techni-Care and draped in sterile fashion.  The bases of the breast had been injected with 1% lidocaine with epinephrine.  After adequate hemostasis and anesthesia taken effect, the procedure was begun.  Both of the breast reductions were informed performed in the following similar manner.  The nipple-areolar complex was marked with 45 mm nipple marker.  This was then incised and the skin de-epithelialized around the nipple-areolar complex down to the inframammary crease in inferior pedicle pattern.  Next the medial, superior, and lateral skin flaps were elevated down to the chest wall.  The excess fat and glandular tissue removed from the inferior pedicle.  The nipple-areolar complex was examined, found to be  pink and viable.  The wound was irrigated with saline irrigation.  Meticulous hemostasis was obtained with the Bovie electrocautery.  The inferior pedicle was then centralized using 3-0 Prolene suture.  The pectoralis major muscle and the surrounding exposed breast tissue were infiltrated with 0.5% Marcaine with epinephrine to provide a postsurgical anesthetic block.  The inferior pedicle was then centralized using 3-0 Prolene suture.  A#10 JP flap fully fluted drain was placed into the breast.  The skin flaps brought together at the inverted T junction with a 2-0 Prolene suture.  The incisions were then stapled for temporary closure.  The breasts were compared and found to have good shape and symmetry.  The incision was then closed from the medial aspect of the JP drain to the medial aspect of the [facility] incision by first placing a few 3-0 Monocryl sutures to tack together the dermal layer and then both the dermal cuticular layer were closed in a single layer using a 2-0 Quill PDO barbed suture.  Lateral to the JP drain incision was closed using 3-0 Monocryl in the dermal layer followed by 3- 0 Monocryl running intracuticular stitch on the skin.  The patient was then placed in the upright position.  The future location of the nipple-areolar complex was marked on both breast mounds using the  45 mm nipple marker.  She was then placed back in the recumbent position.  Both of the nipple-areolar complexes were brought out onto the breast mound in the following similar manner.  The skin was incised as marked and removed full thickness into the subcutaneous tissues.  The nipple- areolar complex was examined, found to be pink and viable then brought out through this aperture and sewn in place.  Using 4-0 Monocryl, the dermal layer followed by 4-0 and 5-0 Monocryl running intracuticular stitch on the skin.  The vertical limb of the Wise pattern had been closed in the dermal layer using 3-0  Monocryl suture.  The cuticular layer was then closed using the 5-0 Monocryl suture in continuity from the nipple-areolar closure in an intracuticular stitch.  The JP drain was sewn in place using 3-0 nylon suture.  The skin and subcutaneous tissues of the breast were then infiltrated with 0.5% Marcaine with epinephrine to provide a postsurgical anesthetic block.  The incisions were dressed with benzoin and Steri-Strips and the nipples additionally with bacitracin ointment and Adaptic.  4x4s were placed over the incisions and ABD pads in the axillary areas.  The patient was placed into light postoperative support bra.  There were no complications.  The patient tolerated procedure well.  The final needle and sponge counts were reported to be correct at  the end of the case.  The patient was then extubated without complications.  She was taken to the recovery room.  She was also recovered without any complications. Both the patient and her family were given proper postoperative wound care instructions including care of the JP drains.  She was then discharged under the care of her family in stable condition.  Followup appointment will be Friday in the office.          ______________________________ Brantley Persons, M.D.     MC/MEDQ  D:  08/28/2011  T:  08/29/2011  Job:  [id number]

## 2011-08-31 ENCOUNTER — Encounter (HOSPITAL_BASED_OUTPATIENT_CLINIC_OR_DEPARTMENT_OTHER): Payer: Self-pay | Admitting: Plastic Surgery

## 2011-12-20 ENCOUNTER — Ambulatory Visit (INDEPENDENT_AMBULATORY_CARE_PROVIDER_SITE_OTHER): Payer: PRIVATE HEALTH INSURANCE | Admitting: Psychology

## 2012-01-15 ENCOUNTER — Emergency Department (HOSPITAL_COMMUNITY): Payer: PRIVATE HEALTH INSURANCE

## 2012-01-15 ENCOUNTER — Emergency Department (HOSPITAL_COMMUNITY)
Admission: EM | Admit: 2012-01-15 | Discharge: 2012-01-15 | Disposition: A | Discharge status: Home / Self Care | Payer: PRIVATE HEALTH INSURANCE | Attending: Emergency Medicine | Admitting: Emergency Medicine

## 2012-01-15 ENCOUNTER — Encounter (HOSPITAL_COMMUNITY): Payer: Self-pay | Admitting: Emergency Medicine

## 2012-01-15 DIAGNOSIS — N201 Calculus of ureter: Secondary | ICD-10-CM

## 2012-01-15 DIAGNOSIS — R109 Unspecified abdominal pain: Secondary | ICD-10-CM | POA: Insufficient documentation

## 2012-01-15 DIAGNOSIS — F411 Generalized anxiety disorder: Secondary | ICD-10-CM | POA: Insufficient documentation

## 2012-01-15 DIAGNOSIS — F3289 Other specified depressive episodes: Secondary | ICD-10-CM | POA: Insufficient documentation

## 2012-01-15 DIAGNOSIS — F329 Major depressive disorder, single episode, unspecified: Secondary | ICD-10-CM | POA: Insufficient documentation

## 2012-01-15 LAB — URINE MICROSCOPIC-ADD ON

## 2012-01-15 LAB — URINALYSIS, ROUTINE W REFLEX MICROSCOPIC
Bilirubin Urine: NEGATIVE
Ketones, ur: NEGATIVE mg/dL
Leukocytes, UA: NEGATIVE
Nitrite: NEGATIVE
Protein, ur: NEGATIVE mg/dL
Urobilinogen, UA: 0.2 mg/dL (ref 0.0–1.0)
pH: 7.5 (ref 5.0–8.0)

## 2012-01-15 MED ORDER — HYDROMORPHONE HCL PF 1 MG/ML IJ SOLN
1.0000 mg | Freq: Once | INTRAMUSCULAR | Status: AC
  Administered 2012-01-15: 1 mg via INTRAVENOUS
  Filled 2012-01-15: qty 1

## 2012-01-15 MED ORDER — ONDANSETRON HCL 4 MG/2ML IJ SOLN
4.0000 mg | Freq: Once | INTRAMUSCULAR | Status: AC
  Administered 2012-01-15: 4 mg via INTRAVENOUS
  Filled 2012-01-15: qty 2

## 2012-01-15 MED ORDER — KETOROLAC TROMETHAMINE 30 MG/ML IJ SOLN
30.0000 mg | Freq: Once | INTRAMUSCULAR | Status: AC
  Administered 2012-01-15: 30 mg via INTRAVENOUS
  Filled 2012-01-15: qty 1

## 2012-01-15 MED ORDER — TAMSULOSIN HCL 0.4 MG PO CAPS: 0.4000 mg | ORAL_CAPSULE | Freq: Every day | ORAL | Status: DC

## 2012-01-15 MED ORDER — OXYCODONE-ACETAMINOPHEN 5-325 MG PO TABS: 2.0000 | ORAL_TABLET | ORAL | Status: AC | PRN

## 2012-01-15 MED ORDER — ONDANSETRON HCL 4 MG PO TABS: 4.0000 mg | ORAL_TABLET | Freq: Four times a day (QID) | ORAL | Status: AC

## 2012-01-15 NOTE — ED Notes (Signed)
Resting now- pain relieved.  Environment adjusted, lights off.

## 2012-01-15 NOTE — Discharge Instructions (Signed)

## 2012-01-15 NOTE — ED Notes (Signed)
Denies any nausea or vomiting prior to or after pain med administered

## 2012-01-15 NOTE — ED Provider Notes (Signed)
History     CSN: [id number]  Arrival date & time 01/15/12  0306   First MD Initiated Contact with Patient 01/15/12 [phone]      Chief Complaint  Patient presents with  . Flank Pain    (Consider location/radiation/quality/duration/timing/severity/associated sxs/prior treatment) HPI History provided by patient. Right flank pain started last night around 7 PM on home. Pain is persistent and nonradiating. Severe and dull in quality. No history of kidney stones or similar pains. Current menses with no noticed hematuria. Some urinary frequency but no dysuria. No fevers. Some nausea but no vomiting. No trauma. No abdominal pain. Past Medical History  Diagnosis Date  . Headache     migraines  . Depression   . Anxiety   . Macromastia     Past Surgical History  Procedure Date  . Cholecystectomy   . Orif distal radius fracture 04/28/2010    left  . Breast reduction surgery 08/28/2011    Procedure: MAMMARY REDUCTION BILATERAL (BREAST);  Surgeon: Mary A Contogiannis;  Location: [facility];  Service: Plastics;  Laterality: Bilateral;    Family History  Problem Relation Age of Onset  . Diabetes Father     History  Substance Use Topics  . Smoking status: Never Smoker   . Smokeless tobacco: Never Used  . Alcohol Use: No    OB History    Grav Para Term Preterm Abortions TAB SAB Ect Mult Living            0      Review of Systems  Constitutional: Negative for fever.  Respiratory: Negative for shortness of breath.   Cardiovascular: Negative for chest pain.  Gastrointestinal: Negative for abdominal pain.  Genitourinary: Positive for flank pain. Negative for dysuria.  Musculoskeletal: Negative for back pain.  Skin: Negative for rash.  Neurological: Negative for headaches.  All other systems reviewed and are negative.    Allergies  Adhesive  Home Medications   Current Outpatient Rx  Name Route Sig Dispense Refill  . GABAPENTIN 100 MG PO CAPS Oral Take 1  capsule (100 mg total) by mouth daily. 90 capsule 5  . SUMATRIPTAN SUCCINATE 100 MG PO TABS Oral Take 100 mg by mouth every 2 (two) hours as needed.      . SUMATRIPTAN-NAPROXEN SODIUM 85-500 MG PO TABS Oral Take 1 tablet by mouth every 2 (two) hours as needed for migraine. 20 tablet 1  . TOPIRAMATE 100 MG PO TABS Oral Take 300 mg by mouth daily. PM    . VENLAFAXINE HCL 37.5 MG PO TABS Oral Take 112.5 mg by mouth daily. AM      BP 127/93  Pulse 76  Temp(Src) 98.4 F (36.9 C) (Oral)  Resp 16  SpO2 97%  LMP 01/15/2012  Physical Exam  Constitutional: She is oriented to person, place, and time. She appears well-developed and well-nourished.  HENT:  Head: Normocephalic and atraumatic.  Eyes: Conjunctivae are normal. Pupils are equal, round, and reactive to light.  Neck: Trachea normal. Neck supple.  Cardiovascular: Normal rate, regular rhythm, S1 normal, S2 normal and normal pulses.     No systolic murmur is present   No diastolic murmur is present  Pulses:      Radial pulses are 2+ on the right side, and 2+ on the left side.  Pulmonary/Chest: Effort normal and breath sounds normal. No respiratory distress. She has no rhonchi.  Abdominal: Soft. Normal appearance and bowel sounds are normal. She exhibits no mass. There is no tenderness.  There is no rebound, no guarding, no CVA tenderness and negative Murphy's sign.       Localizes tenderness to right flank area without reproducible tenderness  Musculoskeletal: She exhibits no edema and no tenderness.  Neurological: She is alert and oriented to person, place, and time. She has normal strength. No cranial nerve deficit or sensory deficit. GCS eye subscore is 4. GCS verbal subscore is 5. GCS motor subscore is 6.  Skin: Skin is warm and dry. No rash noted. She is not diaphoretic.  Psychiatric: Her speech is normal.    ED Course  Procedures (including critical care time)  Labs Reviewed  URINALYSIS, ROUTINE W REFLEX MICROSCOPIC - Abnormal;  Notable for the following:    APPearance HAZY (*)    Hgb urine dipstick LARGE (*)    All other components within normal limits  URINE MICROSCOPIC-ADD ON - Abnormal; Notable for the following:    Squamous Epithelial / LPF FEW (*)    Bacteria, UA MANY (*)    All other components within normal limits  PREGNANCY, URINE   Ct Abdomen Pelvis Wo Contrast  01/15/2012  *RADIOLOGY REPORT*  Clinical Data: Right flank pain.  CT ABDOMEN AND PELVIS WITHOUT CONTRAST  Technique:  Multidetector CT imaging of the abdomen and pelvis was performed following the standard protocol without intravenous contrast.  Comparison: Pelvic ultrasound performed 05/30/2007, and MRI of the lumbar spine performed 03/20/2010  Findings: The visualized lung bases are clear.  The liver and spleen are unremarkable in appearance.  The patient is status post cholecystectomy, with clips noted along the gallbladder fossa.  The pancreas and adrenal glands are unremarkable.  Minimal right-sided hydronephrosis is noted, with right-sided perinephric stranding, and prominence of the right ureter to the level of an obstructing 3 mm stone at the right vesicoureteral junction.  No nonobstructing renal stones are seen.  The left kidney is unremarkable in appearance.  No free fluid is identified.  The small bowel is unremarkable in appearance.  The stomach is within normal limits.  No acute vascular abnormalities are seen.  The appendix is normal in caliber, without evidence for appendicitis.  The colon is grossly unremarkable in appearance.  The bladder is largely decompressed and grossly unremarkable in appearance.  The uterus is within normal limits.  The ovaries are relatively symmetric; no suspicious adnexal masses are seen.  No inguinal lymphadenopathy is seen.  No acute osseous abnormalities are identified.  IMPRESSION: Minimal right-sided hydronephrosis, with an obstructing 3 mm distal stone noted, at the right vesicoureteral junction.  Original Report  Authenticated By: Tonia Ghent, M.D.   Pain control IV Toradol Dilaudid. Zofran for nausea.  Recheck prior to CT scan feeling improved. CT reviewed as above.   MDM   Flank pain with right ureterolithiasis. Pain control. No UTI. Plan outpatient followup with urology. Prescription for pain medications provided.        Sunnie Nielsen, MD 01/15/12 [phone]

## 2012-01-15 NOTE — ED Notes (Signed)
Sudden onset this pm of severe right flank pain.  Frequency of voiding and feeling that she needs to have a BM.  Took 1/2 Hydrocodone and an Ibuprofen and was able to fall asleep.  Awoke suddenly at 2:00am with return of pain

## 2012-01-16 LAB — URINE CULTURE
Colony Count: NO GROWTH
Culture  Setup Time: 201305071250
Culture: NO GROWTH

## 2012-02-24 ENCOUNTER — Emergency Department (HOSPITAL_COMMUNITY): Payer: PRIVATE HEALTH INSURANCE

## 2012-02-24 ENCOUNTER — Emergency Department (HOSPITAL_COMMUNITY)
Admission: EM | Admit: 2012-02-24 | Discharge: 2012-02-24 | Disposition: A | Discharge status: Home / Self Care | Payer: PRIVATE HEALTH INSURANCE | Attending: Emergency Medicine | Admitting: Emergency Medicine

## 2012-02-24 ENCOUNTER — Encounter (HOSPITAL_COMMUNITY): Payer: Self-pay | Admitting: Emergency Medicine

## 2012-02-24 DIAGNOSIS — S60219A Contusion of unspecified wrist, initial encounter: Secondary | ICD-10-CM | POA: Insufficient documentation

## 2012-02-24 DIAGNOSIS — Y92009 Unspecified place in unspecified non-institutional (private) residence as the place of occurrence of the external cause: Secondary | ICD-10-CM | POA: Insufficient documentation

## 2012-02-24 DIAGNOSIS — W010XXA Fall on same level from slipping, tripping and stumbling without subsequent striking against object, initial encounter: Secondary | ICD-10-CM | POA: Insufficient documentation

## 2012-02-24 DIAGNOSIS — S60212A Contusion of left wrist, initial encounter: Secondary | ICD-10-CM

## 2012-02-24 NOTE — ED Notes (Signed)
Patient c/o left wrist pain after slipping and falling. Per patient fell hitting back on dresser and then hit floor landing on left arm. Patient has hx of surgery with pins in that left wrist.

## 2012-02-24 NOTE — ED Notes (Signed)
Patient with no complaints at this time. Respirations even and unlabored. Skin warm/dry. Discharge instructions reviewed with patient at this time. Patient given opportunity to voice concerns/ask questions. Patient discharged at this time and left Emergency Department with steady gait.   

## 2012-02-24 NOTE — ED Notes (Signed)
Patient to xray at this time

## 2012-02-24 NOTE — ED Provider Notes (Signed)
Medical screening examination/treatment/procedure(s) were performed by non-physician practitioner and as supervising physician I was immediately available for consultation/collaboration.   Laray Anger, DO 02/24/12 [phone]

## 2012-02-24 NOTE — Discharge Instructions (Signed)
Contusion A contusion is a deep bruise. Contusions are the result of an injury that caused bleeding under the skin. The contusion may turn blue, purple, or yellow. Minor injuries will give you a painless contusion, but more severe contusions may stay painful and swollen for a few weeks.  CAUSES  A contusion is usually caused by a blow, trauma, or direct force to an area of the body. SYMPTOMS   Swelling and redness of the injured area.   Bruising of the injured area.   Tenderness and soreness of the injured area.   Pain.  DIAGNOSIS  The diagnosis can be made by taking a history and physical exam. An X-ray, CT scan, or MRI may be needed to determine if there were any associated injuries, such as fractures. TREATMENT  Specific treatment will depend on what area of the body was injured. In general, the best treatment for a contusion is resting, icing, elevating, and applying cold compresses to the injured area. Over-the-counter medicines may also be recommended for pain control. Ask your caregiver what the best treatment is for your contusion. HOME CARE INSTRUCTIONS   Put ice on the injured area.   Put ice in a plastic bag.   Place a towel between your skin and the bag.   Leave the ice on for 15 to 20 minutes, 3 to 4 times a day.   Only take over-the-counter or prescription medicines for pain, discomfort, or fever as directed by your caregiver. Your caregiver may recommend avoiding anti-inflammatory medicines (aspirin, ibuprofen, and naproxen) for 48 hours because these medicines may increase bruising.   Rest the injured area.   If possible, elevate the injured area to reduce swelling.  SEEK IMMEDIATE MEDICAL CARE IF:   You have increased bruising or swelling.   You have pain that is getting worse.   Your swelling or pain is not relieved with medicines.  MAKE SURE YOU:   Understand these instructions.   Will watch your condition.   Will get help right away if you are not  doing well or get worse.  Document Released: 06/06/2005 Document Revised: 08/16/2011 Document Reviewed: 07/02/2011 [facility] Patient Information 2012 [place], [place].   As discussed you may take your  home ibuprofen 200 mg, 3 tablets every 6-8 hours when necessary pain.  Also I would start wearing your Velcro wrist splint for support and comfort along with your sling to help keep your wrist elevated for at least the next 24 hours.  In addition I would apply ice to your wrist throughout today to also help minimize any additional swelling which may occur.  I would suggest a recheck in one week by either your primary doctor or Dr. Magnus Ivan if your injury if not improving by that time.

## 2012-02-24 NOTE — ED Provider Notes (Signed)
History     CSN: [id number]  Arrival date & time 02/24/12  0841   First MD Initiated Contact with Patient 02/24/12 [phone]      Chief Complaint  Patient presents with  . Fall  . Wrist Pain    (Consider location/radiation/quality/duration/timing/severity/associated sxs/prior treatment) HPI Comments: Erica Cox slipped last night in her home,  Landing on her left side with her left wrist beneath her.  She has increased pain today and is concerned about possible bony injury as she has a history of ORIF of this joint from a prior fall less than 2 years ago.  She denies numbness or tingling distal to the injury site.  She has used ice without relief of pain,  But has had no further swelling.  She denies any other pain with the fall,  Although states is "sore" across her upper back when she hit against a dresser as she fell.  She denies shortness of breath.  Patient is a 41 y.o. female presenting with fall and wrist pain. The history is provided by the patient.  Fall Pertinent negatives include no numbness.  Wrist Pain Associated symptoms include arthralgias and joint swelling. Pertinent negatives include no numbness or weakness.    Past Medical History  Diagnosis Date  . Headache     migraines  . Depression   . Anxiety   . Macromastia     Past Surgical History  Procedure Date  . Cholecystectomy   . Orif distal radius fracture 04/28/2010    left  . Breast reduction surgery 08/28/2011    Procedure: MAMMARY REDUCTION BILATERAL (BREAST);  Surgeon: Mary A Contogiannis;  Location: [facility];  Service: Plastics;  Laterality: Bilateral;    Family History  Problem Relation Age of Onset  . Diabetes Father     History  Substance Use Topics  . Smoking status: Never Smoker   . Smokeless tobacco: Never Used  . Alcohol Use: No    OB History    Grav Para Term Preterm Abortions TAB SAB Ect Mult Living            0      Review of Systems  Musculoskeletal: Positive  for joint swelling and arthralgias.  Skin: Negative for wound.  Neurological: Negative for weakness and numbness.    Allergies  Adhesive  Home Medications   Current Outpatient Rx  Name Route Sig Dispense Refill  . GABAPENTIN 100 MG PO CAPS Oral Take 1 capsule (100 mg total) by mouth daily. 90 capsule 5  . SUMATRIPTAN SUCCINATE 100 MG PO TABS Oral Take 100 mg by mouth every 2 (two) hours as needed.      . SUMATRIPTAN-NAPROXEN SODIUM 85-500 MG PO TABS Oral Take 1 tablet by mouth every 2 (two) hours as needed for migraine. 20 tablet 1  . TAMSULOSIN HCL 0.4 MG PO CAPS Oral Take 1 capsule (0.4 mg total) by mouth daily. 14 capsule 0  . TOPIRAMATE 100 MG PO TABS Oral Take 300 mg by mouth daily. PM    . VENLAFAXINE HCL 37.5 MG PO TABS Oral Take 112.5 mg by mouth daily. AM      BP 112/85  Pulse 85  Temp 98.7 F (37.1 C) (Oral)  Resp 14  Ht 5\' 4"  (1.626 m)  Wt 142 lb (64.411 kg)  BMI 24.37 kg/m2  SpO2 100%  LMP 01/26/2012  Physical Exam  Constitutional: She appears well-developed and well-nourished.  HENT:  Head: Atraumatic.  Neck: Normal range of motion.  Cardiovascular:       Pulses equal bilaterally  Musculoskeletal: She exhibits edema and tenderness.       Left wrist: She exhibits decreased range of motion, tenderness and swelling. She exhibits no crepitus and no deformity.       Mild edema noted of left wrist with ttp over ulnar styloid.  Increased pain with decreased ROM toward ulnar.  Flexion and extension normal.  Cap refill less than 3 sec.  Distal sensation intact,  Patient moves all fingers without discomfort.  Neurological: She is alert. She has normal strength. She displays normal reflexes. No sensory deficit.       Equal strength  Skin: Skin is warm and dry.  Psychiatric: She has a normal mood and affect.    ED Course  Procedures (including critical care time)  Labs Reviewed - No data to display Dg Wrist Complete Left  02/24/2012  *RADIOLOGY REPORT*  Clinical  Data: Fall, left wrist pain  LEFT WRIST - COMPLETE 3+ VIEW  Comparison: 04/22/2010  Findings: Fixation of previously seen distal left radial fracture with side plate and screws noted without evidence for hardware complication.  Remote ulnar styloid process fracture.  No soft tissue abnormality or other radiopaque foreign body.  No new acute fracture or dislocation.  IMPRESSION: Remote distal radius and ulna fracture deformity, no acute abnormality.  Original Report Authenticated By: Harrel Lemon, M.D.     1. Contusion of wrist, left       MDM  Reviewed xrays .  Discussed tx including ice,  Elevation,  Compression (pt has sling and velcro wrist splint which she will apply once home.).  Ice,  Ibuprofen.  Recheck by pcp or her orthopedist in 1 week if not improved.          Burgess Amor, [place] 02/24/12 1132

## 2012-02-29 ENCOUNTER — Ambulatory Visit: Payer: Self-pay | Admitting: Urology

## 2012-07-14 ENCOUNTER — Ambulatory Visit (HOSPITAL_COMMUNITY)
Admission: RE | Admit: 2012-07-14 | Discharge: 2012-07-14 | Disposition: A | Discharge status: Home / Self Care | Payer: PRIVATE HEALTH INSURANCE | Source: Ambulatory Visit | Attending: Adult Health | Admitting: Adult Health

## 2012-07-14 ENCOUNTER — Other Ambulatory Visit: Payer: Self-pay | Admitting: Adult Health

## 2012-07-14 DIAGNOSIS — R05 Cough: Secondary | ICD-10-CM | POA: Insufficient documentation

## 2012-07-14 DIAGNOSIS — R059 Cough, unspecified: Secondary | ICD-10-CM

## 2012-12-17 ENCOUNTER — Telehealth: Payer: Self-pay | Admitting: Adult Health

## 2012-12-18 NOTE — Telephone Encounter (Signed)
Left message on voice to call me in am.

## 2013-06-30 ENCOUNTER — Emergency Department (HOSPITAL_COMMUNITY)
Admission: EM | Admit: 2013-06-30 | Discharge: 2013-07-01 | Disposition: A | Discharge status: Home / Self Care | Payer: PRIVATE HEALTH INSURANCE | Attending: Emergency Medicine | Admitting: Emergency Medicine

## 2013-06-30 ENCOUNTER — Encounter (HOSPITAL_COMMUNITY): Payer: Self-pay | Admitting: Emergency Medicine

## 2013-06-30 DIAGNOSIS — F329 Major depressive disorder, single episode, unspecified: Secondary | ICD-10-CM | POA: Insufficient documentation

## 2013-06-30 DIAGNOSIS — F3289 Other specified depressive episodes: Secondary | ICD-10-CM | POA: Insufficient documentation

## 2013-06-30 DIAGNOSIS — Z79899 Other long term (current) drug therapy: Secondary | ICD-10-CM | POA: Insufficient documentation

## 2013-06-30 DIAGNOSIS — Z8742 Personal history of other diseases of the female genital tract: Secondary | ICD-10-CM | POA: Insufficient documentation

## 2013-06-30 DIAGNOSIS — L509 Urticaria, unspecified: Secondary | ICD-10-CM

## 2013-06-30 DIAGNOSIS — F411 Generalized anxiety disorder: Secondary | ICD-10-CM | POA: Insufficient documentation

## 2013-06-30 MED ORDER — DIPHENHYDRAMINE HCL 25 MG PO CAPS
25.0000 mg | ORAL_CAPSULE | Freq: Once | ORAL | Status: AC
  Administered 2013-06-30: 25 mg via ORAL
  Filled 2013-06-30: qty 1

## 2013-06-30 MED ORDER — FAMOTIDINE 20 MG PO TABS
20.0000 mg | ORAL_TABLET | Freq: Once | ORAL | Status: AC
  Administered 2013-06-30: 20 mg via ORAL
  Filled 2013-06-30: qty 1

## 2013-06-30 MED ORDER — PREDNISONE 50 MG PO TABS
60.0000 mg | ORAL_TABLET | Freq: Once | ORAL | Status: AC
  Administered 2013-06-30: 60 mg via ORAL
  Filled 2013-06-30 (×2): qty 1

## 2013-06-30 NOTE — ED Notes (Signed)
Patient c/o rash that started last night.  States went to MD today; was given a steroid shot, but states rash is spreading.

## 2013-06-30 NOTE — ED Provider Notes (Signed)
CSN: [id number]     Arrival date & time 06/30/13  2314 History   First MD Initiated Contact with Patient 06/30/13 2325    Scribed for Delora Fuel, MD, the patient was seen in room APA03/APA03. This chart was scribed by Denice Bors, ED scribe. Patient's care was started at 11:26 PM  Chief Complaint  Patient presents with  . Urticaria  . Rash   (Consider location/radiation/quality/duration/timing/severity/associated sxs/prior Treatment) The history is provided by the patient.   HPI Comments: Erica Cox is a 42 y.o. female who presents to the Emergency Department complaining of waxing and waning moderate to severe pruritic urticarial rash on legs, arms, and trunk onset last night of unknown etiology. Reports she went to her PCP today and was given a prednisone shot, but rash is spreading. Denies associated difficulty breathing, tongue swelling, lip swelling, change in medications, change in lotions, and change in diet. Reports she was trimming bushes at her house, but denies any other change in routine. Reports symptoms are exacerbated at night and alleviated by benadryl. Reports taking 2 prednisone, 1 benedryl at 6 pm and 1 benedryl at 9 pm with moderate relief of symptoms until heading to bed tonight.   Past Medical History  Diagnosis Date  . Headache(784.0)     migraines  . Depression   . Anxiety   . Macromastia    Past Surgical History  Procedure Laterality Date  . Cholecystectomy    . Orif distal radius fracture  04/28/2010    left  . Breast reduction surgery  08/28/2011    Procedure: MAMMARY REDUCTION BILATERAL (BREAST);  Surgeon: Mary A Contogiannis;  Location: [facility];  Service: Plastics;  Laterality: Bilateral;   Family History  Problem Relation Age of Onset  . Diabetes Father    History  Substance Use Topics  . Smoking status: Never Smoker   . Smokeless tobacco: Never Used  . Alcohol Use: No   OB History   Grav Para Term Preterm Abortions TAB  SAB Ect Mult Living            0     Review of Systems  Skin: Positive for rash.  All other systems reviewed and are negative.   A complete 10 system review of systems was obtained and all systems are negative except as noted in the HPI and PMHx.    Allergies  Adhesive  Home Medications   Current Outpatient Rx  Name  Route  Sig  Dispense  Refill  . SUMAtriptan (IMITREX) 100 MG tablet   Oral   Take 100 mg by mouth every 2 (two) hours as needed.           . SUMAtriptan-naproxen (TREXIMET) 85-500 MG per tablet   Oral   Take 1 tablet by mouth every 2 (two) hours as needed for migraine.   20 tablet   1   . topiramate (TOPAMAX) 100 MG tablet   Oral   Take 300 mg by mouth daily. PM         . venlafaxine (EFFEXOR) 37.5 MG tablet   Oral   Take 112.5 mg by mouth daily. AM         . gabapentin (NEURONTIN) 100 MG capsule   Oral   Take 1 capsule (100 mg total) by mouth daily.   90 capsule   5   . Tamsulosin HCl (FLOMAX) 0.4 MG CAPS   Oral   Take 1 capsule (0.4 mg total) by mouth daily.  14 capsule   0    BP 138/95  Pulse 109  Temp(Src) 97.8 F (36.6 C) (Oral)  Resp 20  Ht 5\' 4"  (1.626 m)  Wt 153 lb (69.4 kg)  BMI 26.25 kg/m2  SpO2 95% Physical Exam  Nursing note and vitals reviewed. Constitutional: She is oriented to person, place, and time. She appears well-developed and well-nourished. No distress.  HENT:  Head: Normocephalic and atraumatic.  No mucosal lesions   Eyes: EOM are normal.  Neck: Neck supple. No tracheal deviation present.  Cardiovascular: Normal rate.   Pulmonary/Chest: Effort normal. No respiratory distress.  Musculoskeletal: Normal range of motion.  Neurological: She is alert and oriented to person, place, and time.  Skin: Skin is warm and dry. Rash noted. Rash is urticarial.  Over arms, legs, and trunk  Psychiatric: She has a normal mood and affect. Her behavior is normal.    ED Course  Procedures (including critical care  time) COORDINATION OF CARE:  Nursing notes reviewed. Vital signs reviewed. Initial pt interview and examination performed.    Treatment plan initiated: Medications  diphenhydrAMINE (BENADRYL) capsule 25 mg (25 mg Oral Given 06/30/13 2336)  famotidine (PEPCID) tablet 20 mg (20 mg Oral Given 06/30/13 2336)  predniSONE (DELTASONE) tablet 60 mg (60 mg Oral Given 06/30/13 2335)     MDM   1. Urticaria    Urticaria of uncertain cause. Some of the lesions have some central clearing and resemble target lesions that might suggest erythema multiforme, the majority of lesions are clearly urticarial. No mucosal to suggest Stevens-Johnson syndrome. She will be given additional prednisone, and  diphenhydramine, and also given initial dose of famotidine.  Following above noted treatment. Rash is starting to fade and itching is decreased but still significant. She's given a dose of lorazepam and is discharged with prescription for prednisone. She is advised to use over-the-counter Claritin or Zyrtec as well as over-the-counter famotidine or ranitidine and he is diphenhydramine on an as-needed basis.   I personally performed the services described in this documentation, which was scribed in my presence. The recorded information has been reviewed and is accurate.     Delora Fuel, MD 26/33/35 [id number]

## 2013-07-01 MED ORDER — FAMOTIDINE 20 MG PO TABS
20.0000 mg | ORAL_TABLET | Freq: Once | ORAL | Status: AC
  Administered 2013-07-01: 20 mg via ORAL
  Filled 2013-07-01: qty 1

## 2013-07-01 MED ORDER — LORAZEPAM 1 MG PO TABS
1.0000 mg | ORAL_TABLET | Freq: Once | ORAL | Status: AC
  Administered 2013-07-01: 1 mg via ORAL
  Filled 2013-07-01: qty 1

## 2013-07-01 MED ORDER — PREDNISONE 50 MG PO TABS: 50.0000 mg | ORAL_TABLET | Freq: Every day | ORAL | Status: DC

## 2013-07-01 NOTE — ED Notes (Signed)
Mild decrease in redness of the urticaria - states it still itches a lot

## 2013-07-01 NOTE — ED Notes (Signed)
Cool compresses applied to multiple areas, ice pack x 2 to areas with greatest amount of urticaria

## 2013-07-16 ENCOUNTER — Other Ambulatory Visit: Payer: Self-pay

## 2013-12-14 ENCOUNTER — Ambulatory Visit (INDEPENDENT_AMBULATORY_CARE_PROVIDER_SITE_OTHER): Payer: PRIVATE HEALTH INSURANCE | Admitting: Adult Health

## 2013-12-14 ENCOUNTER — Encounter: Payer: Self-pay | Admitting: Adult Health

## 2013-12-14 VITALS — BP 120/76 | Ht 64.0 in | Wt 154.0 lb

## 2013-12-14 DIAGNOSIS — N6459 Other signs and symptoms in breast: Secondary | ICD-10-CM

## 2013-12-14 DIAGNOSIS — N63 Unspecified lump in unspecified breast: Secondary | ICD-10-CM | POA: Insufficient documentation

## 2013-12-14 DIAGNOSIS — N393 Stress incontinence (female) (male): Secondary | ICD-10-CM

## 2013-12-14 HISTORY — DX: Stress incontinence (female) (male): N39.3

## 2013-12-14 HISTORY — DX: Unspecified lump in unspecified breast: N63.0

## 2013-12-14 NOTE — Patient Instructions (Signed)
Urinary Incontinence Urinary incontinence is the involuntary loss of urine from your bladder. CAUSES  There are many causes of urinary incontinence. They include:  Medicines.  Infections.  Prostatic enlargement, leading to overflow of urine from your bladder.  Surgery.  Neurological diseases.  Emotional factors. SIGNS AND SYMPTOMS Urinary Incontinence can be divided into four types: 1. Urge incontinence. Urge incontinence is the involuntary loss of urine before you have the opportunity to go to the bathroom. There is a sudden urge to void but not enough time to reach a bathroom. 2. Stress incontinence. Stress incontinence is the sudden loss of urine with any activity that forces urine to pass. It is commonly caused by anatomical changes to the pelvis and sphincter areas of your body. 3. Overflow incontinence. Overflow incontinence is the loss of urine from an obstructed opening to your bladder. This results in a backup of urine and a resultant buildup of pressure within the bladder. When the pressure within the bladder exceeds the closing pressure of the sphincter, the urine overflows, which causes incontinence, similar to water overflowing a dam. 4. Total incontinence. Total incontinence is the loss of urine as a result of the inability to store urine within your bladder. DIAGNOSIS  Evaluating the cause of incontinence may require:  A thorough and complete medical and obstetric history.  A complete physical exam.  Laboratory tests such as a urine culture and sensitivities. When additional tests are indicated, they can include:  An ultrasound exam.  Kidney and bladder X-rays.  Cystoscopy. This is an exam of the bladder using a narrow scope.  Urodynamic testing to test the nerve function to the bladder and sphincter areas. TREATMENT  Treatment for urinary incontinence depends on the cause:  For urge incontinence caused by a bacterial infection, antibiotics will be prescribed.  If the urge incontinence is related to medicines you take, your health care provider may have you change the medicine.  For stress incontinence, surgery to re-establish anatomical support to the bladder or sphincter, or both, will often correct the condition.  For overflow incontinence caused by an enlarged prostate, an operation to open the channel through the enlarged prostate will allow the flow of urine out of the bladder. In women with fibroids, a hysterectomy may be recommended.  For total incontinence, surgery on your urinary sphincter may help. An artificial urinary sphincter (an inflatable cuff placed around the urethra) may be required. In women who have developed a hole-like passage between their bladder and vagina (vesicovaginal fistula), surgery to close the fistula often is required. HOME CARE INSTRUCTIONS  Normal daily hygiene and the use of pads or adult diapers that are changed regularly will help prevent odors and skin damage.  Avoid caffeine. It can overstimulate your bladder.  Use the bathroom regularly. Try about every 2 3 hours to go to the bathroom, even if you do not feel the need to do so. Take time to empty your bladder completely. After urinating, wait a minute. Then try to urinate again.  For causes involving nerve dysfunction, keep a log of the medicines you take and a journal of the times you go to the bathroom. SEEK MEDICAL CARE IF:  You experience worsening of pain instead of improvement in pain after your procedure.  Your incontinence becomes worse instead of better. SEE IMMEDIATE MEDICAL CARE IF:  You experience fever or shaking chills.  You are unable to pass your urine.  You have redness spreading into your groin or down into your thighs. MAKE  SURE YOU:   Understand these instructions.   Will watch your condition.  Will get help right away if you are not doing well or get worse. Document Released: 10/04/2004 Document Revised: 06/17/2013 Document  Reviewed: 02/03/2013 [facility] Patient Information 2014 [place]. Try kegels and void often, decrease caffeine Refer to urologist if persists Get mammogram and [place] at [facility]

## 2013-12-14 NOTE — Progress Notes (Signed)
Subjective:     Patient ID: Erica Cox, female   DOB: 1971-08-29, 43 y.o.   MRN: [id number]  HPI Erica Cox is a 43 year old white female, divorced in complaining of right breast nodule x 1 week and also says she has urinary incontinence with coughing, sneezing and jumping and now walking.  Review of Systems See HPI Reviewed past medical,surgical, social and family history. Reviewed medications and allergies.     Objective:   Physical Exam BP 120/76  Ht 5' 4"  (1.626 m)  Wt 154 lb (69.854 kg)  BMI 26.42 kg/m2    Skin warm and dry,  Breasts:no dominate palpable mass, retraction or nipple discharge on left, on right there is a nodule at 1 0' clock that is oval and rubbery and slightly mobile and 2 FB from areola, no retraction or nipple discharge she is sp breast reduction in 2012.  Assessment:     Right breast nodule SUI    Plan:     Get diagnostic mammogram and right [place] 4/15 at 3:30 pm at [facility] Decrease caffeine, do Kegels and void often and review handout on SUI by Krames  Follow up in 4 weeks if persists may refer to urologist

## 2013-12-21 ENCOUNTER — Other Ambulatory Visit: Payer: Self-pay | Admitting: Adult Health

## 2013-12-23 ENCOUNTER — Ambulatory Visit (HOSPITAL_COMMUNITY)
Admission: RE | Admit: 2013-12-23 | Discharge: 2013-12-23 | Disposition: A | Discharge status: Home / Self Care | Payer: PRIVATE HEALTH INSURANCE | Source: Ambulatory Visit | Attending: Adult Health | Admitting: Adult Health

## 2013-12-23 ENCOUNTER — Other Ambulatory Visit: Payer: Self-pay | Admitting: Adult Health

## 2013-12-23 DIAGNOSIS — N63 Unspecified lump in unspecified breast: Secondary | ICD-10-CM

## 2014-01-06 ENCOUNTER — Encounter (HOSPITAL_COMMUNITY): Payer: Self-pay

## 2014-01-06 ENCOUNTER — Ambulatory Visit (HOSPITAL_COMMUNITY)
Admission: RE | Admit: 2014-01-06 | Discharge: 2014-01-06 | Disposition: A | Discharge status: Home / Self Care | Payer: PRIVATE HEALTH INSURANCE | Source: Ambulatory Visit | Attending: Adult Health | Admitting: Adult Health

## 2014-01-06 ENCOUNTER — Ambulatory Visit (HOSPITAL_COMMUNITY)
Admission: RE | Admit: 2014-01-06 | Discharge: 2014-01-06 | Disposition: A | Discharge status: Home / Self Care | Payer: PRIVATE HEALTH INSURANCE | Source: Ambulatory Visit | Attending: Internal Medicine | Admitting: Internal Medicine

## 2014-01-06 ENCOUNTER — Other Ambulatory Visit (HOSPITAL_COMMUNITY): Payer: Self-pay | Admitting: Internal Medicine

## 2014-01-06 DIAGNOSIS — IMO0002 Reserved for concepts with insufficient information to code with codable children: Secondary | ICD-10-CM

## 2014-01-06 DIAGNOSIS — N63 Unspecified lump in unspecified breast: Secondary | ICD-10-CM

## 2014-01-06 DIAGNOSIS — R229 Localized swelling, mass and lump, unspecified: Principal | ICD-10-CM

## 2014-01-06 DIAGNOSIS — N62 Hypertrophy of breast: Secondary | ICD-10-CM | POA: Insufficient documentation

## 2014-01-06 MED ORDER — LIDOCAINE HCL (PF) 2 % IJ SOLN
10.0000 mL | Freq: Once | INTRAMUSCULAR | Status: AC
  Administered 2014-01-06: 10 mL

## 2014-01-06 MED ORDER — LIDOCAINE HCL (PF) 2 % IJ SOLN
INTRAMUSCULAR | Status: AC
  Administered 2014-01-06: 10 mL
  Filled 2014-01-06: qty 10

## 2014-01-06 NOTE — Discharge Instructions (Signed)
Breast Biopsy Care After These instructions give you information on caring for yourself after your procedure. Your doctor may also give you more specific instructions. Call your doctor if you have any problems or questions after your procedure. HOME CARE  Only take medicine as told by your doctor.  Do not take aspirin.  Keep your sutures (stitches) dry when bathing.  Protect the biopsy area. Do not let the area get bumped.  Avoid activities that could pull the biopsy site open until your doctor approves. This includes:  Stretching.  Reaching.  Exercise.  Sports.  Lifting more than 3lb.  Continue your normal diet.  Wear a good support bra for as long as told by your doctor.  Change any bandages (dressings) as told by your doctor.  Do not drink alcohol while taking pain medicine.  Keep all doctor visits as told. Ask when your test results will be ready. Make sure you get your test results. GET HELP RIGHT AWAY IF:   You have a fever.  You have more bleeding (more than a small spot) from the biopsy site.  You have trouble breathing.  You have yellowish-white fluid (pus) coming from the biopsy site.  You have redness, puffiness (swelling), or more pain in the biopsy site.  You have a bad smell coming from the biopsy site.  Your biopsy site opens after sutures, staples, or sticky strips have been removed.  You have a rash.  You need stronger medicine. MAKE SURE YOU:  Understand these instructions.  Will watch your condition.  Will get help right away if you are not doing well or get worse. Document Released: 06/23/2009 Document Revised: 11/19/2011 Document Reviewed: 10/07/2011 [facility] Patient Information 2014 [place].  Breast Biopsy A breast biopsy is a test during which a sample of tissue is taken from your breast. The breast tissue is looked at under a microscope for cancer cells.  BEFORE THE PROCEDURE  Make plans to have someone drive you home  after the test.  Do not smoke for 2 weeks before the test. Stop smoking, if you smoke.  Do not drink alcohol for 24 hours before the test.  Wear a good support bra to the test. PROCEDURE  You may be given one of the following:  A medicine to numb the breast area (local anesthesia).  A medicine to make you sleep (general anesthesia). There are different types of breast biopsies. They include:  Fine-needle aspiration.  A needle is put into the breast lump.  The needle takes out fluid and cells from the lump.  Ultrasound imaging may be used to help find the lump and to put the needle it the right spot.  Core-needle biopsy.  A needle is put into the breast lump.  The needle is put in your breast 3 6 times.  The needle removes breast tissue.  An ultrasound image or X-ray is often used to find the right spot to put in the needle.  Stereotactic biopsy.  X-rays and a computer are used to study X-ray pictures of the breast lump.  The computer finds where the needle needs to be put into the breast.  Tissue samples are taken out.  Vacuum-assisted biopsy.  A small cut (incision) is made in your breast.  A biopsy device is put through the cut and into the breast tissue.  The biopsy device draws abnormal breast tissue into the biopsy device.  A large tissue sample is often removed.  No stitches are needed.  Ultrasound-guided core-needle  biopsy.  Ultrasound imaging helps guide the needle into the area of the breast that is not normal.  A cut is made in the breast. The needle is put in the needle.  Tissue samples are taken out.  Open biopsy.  A large cut is made in the breast.  Your doctor will try to remove the whole breast lump or as much as possible. All tissue, fluid, or cell samples are looked at under a microscope.  AFTER THE PROCEDURE  You will be taken to an area to recover. You will be able to go home once you are doing well and are without  problems.  You may have bruising on your breast. This is normal.  A pressure bandage (dressing) may be put on your breast for 24 8 hours. This type of bandage is wrapped tightly around your chest. It helps stop fluid from building up underneath tissues. Document Released: 11/19/2011 Document Reviewed: 11/19/2011 [facility] Patient Information 2014 [place].

## 2014-01-11 ENCOUNTER — Telehealth: Payer: Self-pay | Admitting: Adult Health

## 2014-01-11 NOTE — Telephone Encounter (Signed)
Pt has appt 5/15 will talk more about breast US then has appt with dr Arnoldo Morale

## 2014-01-22 ENCOUNTER — Ambulatory Visit: Payer: PRIVATE HEALTH INSURANCE | Admitting: Adult Health

## 2014-02-02 ENCOUNTER — Encounter (HOSPITAL_COMMUNITY): Payer: Self-pay | Admitting: Pharmacy Technician

## 2014-02-02 NOTE — Patient Instructions (Addendum)
Erica Cox Page  02/02/2014   Your procedure is scheduled on:  02/05/14  Report to [facility] at 07:20 AM.  Call this number if you have problems the morning of surgery: [phone]   Remember:   Do not eat food or drink liquids after midnight.   Take these medicines the morning of surgery with A SIP OF WATER: Fetmiza   Do not wear jewelry, make-up or nail polish.  Do not wear lotions, powders, or perfumes. You may wear deodorant.  Do not shave 48 hours prior to surgery. Men may shave face and neck.  Do not bring valuables to the hospital.  [facility] is not responsible for any belongings or valuables.               Contacts, dentures or bridgework may not be worn into surgery.  Leave suitcase in the car. After surgery it may be brought to your room.  For patients admitted to the hospital, discharge time is determined by your treatment team.               Patients discharged the day of surgery will not be allowed to drive home.    Special Instructions: Shower using CHG 1 night before surgery and the morning before surgery.  Use special wash - you have one bottle of CHG for both showers.     Please read over the following fact sheets that you were given: Surgical Site Infection Prevention, Anesthesia Post-op Instructions and Care and Recovery After Surgery   PATIENT INSTRUCTIONS POST-ANESTHESIA  IMMEDIATELY FOLLOWING SURGERY:  Do not drive or operate machinery for the first twenty four hours after surgery.  Do not make any important decisions for twenty four hours after surgery or while taking narcotic pain medications or sedatives.  If you develop intractable nausea and vomiting or a severe headache please notify your doctor immediately.  FOLLOW-UP:  Please make an appointment with your surgeon as instructed. You do not need to follow up with anesthesia unless specifically instructed to do so.  WOUND CARE INSTRUCTIONS (if applicable):  Keep a dry clean dressing on the  anesthesia/puncture wound site if there is drainage.  Once the wound has quit draining you may leave it open to air.  Generally you should leave the bandage intact for twenty four hours unless there is drainage.  If the epidural site drains for more than 36-48 hours please call the anesthesia department.  QUESTIONS?:  Please feel free to call your physician or the hospital operator if you have any questions, and they will be happy to assist you.      Breast Biopsy Care After These instructions give you information on caring for yourself after your procedure. Your doctor may also give you more specific instructions. Call your doctor if you have any problems or questions after your procedure. HOME CARE  Only take medicine as told by your doctor.  Do not take aspirin.  Keep your sutures (stitches) dry when bathing.  Protect the biopsy area. Do not let the area get bumped.  Avoid activities that could pull the biopsy site open until your doctor approves. This includes:  Stretching.  Reaching.  Exercise.  Sports.  Lifting more than 3lb.  Continue your normal diet.  Wear a good support bra for as long as told by your doctor.  Change any bandages (dressings) as told by your doctor.  Do not drink alcohol while taking pain medicine.  Keep all doctor visits as told. Ask when your  test results will be ready. Make sure you get your test results. GET HELP RIGHT AWAY IF:   You have a fever.  You have more bleeding (more than a small spot) from the biopsy site.  You have trouble breathing.  You have yellowish-Shyonna Carlin fluid (pus) coming from the biopsy site.  You have redness, puffiness (swelling), or more pain in the biopsy site.  You have a bad smell coming from the biopsy site.  Your biopsy site opens after sutures, staples, or sticky strips have been removed.  You have a rash.  You need stronger medicine. MAKE SURE YOU:  Understand these instructions.  Will watch your  condition.  Will get help right away if you are not doing well or get worse. Document Released: 06/23/2009 Document Revised: 11/19/2011 Document Reviewed: 10/07/2011 [facility] Patient Information 2014 [place]. Breast Biopsy A breast biopsy is a procedure where a sample of breast tissue is removed from your breast. The tissue is examined under a microscope to see if cancerous cells are present. A breast biopsy is done when there is:  Any undiagnosed breast mass (tumor).  Nipple abnormalities, dimpling, crusting, or ulcerations.  Abnormal discharge from the nipple, especially blood.  Redness, swelling, and pain of the breast.  Calcium deposits (calcifications) or abnormalities seen on a mammogram, ultrasound result, or results of magnetic resonance imaging (MRI).  Suspicious changes in the breast seen on your mammogram. If the tumor is found to be cancerous (malignant), a breast biopsy can help to determine what the best treatment is for you. There are many different types of breast biopsies. Talk to your caregiver about your options and which type is best for you. LET YOUR CAREGIVER KNOW ABOUT:  Allergies to food or medicine.  Medicines taken, including vitamins, herbs, eyedrops, over-the-counter medicines, and creams.  Use of steroids (by mouth or creams).  Previous problems with anesthetics or numbing medicines.  History of bleeding problems or blood clots.  Previous surgery.  Other health problems, including diabetes and kidney problems.  Any recent colds or infections.  Possibility of pregnancy, if this applies. RISKS AND COMPLICATIONS   Bleeding.  Infection.  Allergy to medicines.  Bruising and swelling of the breast.  Alteration in the shape of the breast.  Not finding the lump or abnormality.  Needing more surgery. BEFORE THE PROCEDURE  Arrange for someone to drive you home after the procedure.  Do not smoke for 2 weeks before the procedure. Stop  smoking, if you smoke.  Do not drink alcohol for 24 hours before procedure.  Wear a good support bra to the procedure. PROCEDURE  You may be given a medicine to numb the breast area (local anesthesia) or a medicine to make you sleep (general anesthesia) during the procedure. The following are the different types of biopsies that can be performed.   Fine-needle aspiration A thin needle is attached to a syringe and inserted into the breast lump. Fluid and cells are removed and then looked at under a microscope. If the breast lump cannot be felt, an ultrasound may be used to help locate the lump and place the needle in the correct area.   Core needle biopsy A wide, hollow needle (core needle) is inserted into the breast lump 3 6 times to get tissue samples or cores. The samples are removed. The needle is usually placed in the correct area by using an ultrasound or X-ray.   Stereotactic biopsy X-ray equipment and a computer are used to analyze [employer]  of the breast lump. The computer then finds exactly where the core needle needs to be inserted. Tissue samples are removed.   Vacuum-assisted biopsy A small incision (less than  inch) is made in your breast. A biopsy device that includes a hollow needle and vacuum is passed through the incision and into the breast tissue. The vacuum gently draws abnormal breast tissue into the needle to remove it. This type of biopsy removes a larger tissue sample than a regular core needle biopsy. No stitches are needed, and there is usually little scarring.  Ultrasound-guided core needle biopsy A high frequency ultrasound helps guide the core needle to the area of the mass or abnormality. An incision is made to insert the needle. Tissue samples are removed.  Open biopsy A larger incision is made in the breast. Your caregiver will attempt to remove the whole breast lump or as much as possible. AFTER THE PROCEDURE  You will be taken to the recovery area. If  you are doing well and have no problems, you will be allowed to go home.  You may notice bruising on your breast. This is normal.  Your caregiver may apply a pressure dressing on your breast for 24 48 hours. A pressure dressing is a bandage that is wrapped tightly around the chest to stop fluid from collecting underneath tissues. Document Released: 08/27/2005 Document Revised: 12/22/2012 Document Reviewed: 09/27/2011 [facility] Patient Information 2014 [place].

## 2014-02-03 ENCOUNTER — Encounter (HOSPITAL_COMMUNITY)
Admission: RE | Admit: 2014-02-03 | Discharge: 2014-02-03 | Disposition: A | Discharge status: Home / Self Care | Payer: PRIVATE HEALTH INSURANCE | Source: Ambulatory Visit | Attending: General Surgery | Admitting: General Surgery

## 2014-02-03 ENCOUNTER — Encounter (HOSPITAL_COMMUNITY): Payer: Self-pay

## 2014-02-03 HISTORY — DX: Sciatica, unspecified side: M54.30

## 2014-02-03 LAB — CBC WITH DIFFERENTIAL/PLATELET
BASOS PCT: 1 % (ref 0–1)
Basophils Absolute: 0.1 10*3/uL (ref 0.0–0.1)
EOS ABS: 0.2 10*3/uL (ref 0.0–0.7)
Eosinophils Relative: 4 % (ref 0–5)
HCT: 44.4 % (ref 36.0–46.0)
Hemoglobin: 14.7 g/dL (ref 12.0–15.0)
Lymphocytes Relative: 40 % (ref 12–46)
Lymphs Abs: 1.8 10*3/uL (ref 0.7–4.0)
MCH: 30.2 pg (ref 26.0–34.0)
MCHC: 33.1 g/dL (ref 30.0–36.0)
MCV: 91.2 fL (ref 78.0–100.0)
MONO ABS: 0.3 10*3/uL (ref 0.1–1.0)
Monocytes Relative: 7 % (ref 3–12)
NEUTROS PCT: 48 % (ref 43–77)
Neutro Abs: 2.2 10*3/uL (ref 1.7–7.7)
Platelets: 258 10*3/uL (ref 150–400)
RBC: 4.87 MIL/uL (ref 3.87–5.11)
RDW: 12.8 % (ref 11.5–15.5)
WBC: 4.6 10*3/uL (ref 4.0–10.5)

## 2014-02-03 LAB — BASIC METABOLIC PANEL
BUN: 17 mg/dL (ref 6–23)
CO2: 20 meq/L (ref 19–32)
Calcium: 9.5 mg/dL (ref 8.4–10.5)
Chloride: 107 mEq/L (ref 96–112)
Creatinine, Ser: 0.86 mg/dL (ref 0.50–1.10)
GFR calc Af Amer: >90 mL/min (ref >90)
GFR, EST NON AFRICAN AMERICAN: 82 mL/min — AB (ref >90)
Glucose, Bld: 99 mg/dL (ref 70–99)
Potassium: 3.7 mEq/L (ref 3.7–5.3)
Sodium: 143 mEq/L (ref 137–147)

## 2014-02-03 LAB — HCG, SERUM, QUALITATIVE: Preg, Serum: NEGATIVE

## 2014-02-03 MED ORDER — CHLORHEXIDINE GLUCONATE 4 % EX LIQD
1.0000 | Freq: Once | CUTANEOUS | Status: DC
Start: 2014-02-03 — End: 2014-02-04

## 2014-02-03 NOTE — H&P (Signed)
  NTS SOAP Note  Vital Signs:  Vitals as of: 4/32/7614: Systolic 709: Diastolic 95: Heart Rate 103: Temp 96.74F: Height 75f 3in: Weight 149Lbs 0 Ounces: BMI 26.39  BMI : 26.39 kg/m2  Subjective: This 89Years 1 Months old Female presents for of a right breast lump.  Found recently by patient.  Biopsy shows atypical lobular hyperplasia.  Is referred for biopsy of the lump.  No family h/o breast carcinoma.  No nipple discharge noted.  Review of Symptoms:  Constitutional:unremarkable   Head:unremarkable    Eyes:unremarkable   sinus problems Cardiovascular:  unremarkable   Respiratory:unremarkable   Gastrointestin    heartburn Genitourinary:unremarkable     Musculoskeletal:unremarkable   Skin:unremarkable   as above Hematolgic/Lymphatic:unremarkable     Allergic/Immunologic:unremarkable     Past Medical History:    Reviewed  Past Medical History  Surgical History: mammoplasty bilaterally 2012, cholecystectomy, wrist surgery Medical Problems: unremarkable Allergies: nkda Medications: topomax, omeprazole, antara, fetizema, loestrogen, sumatriptan, maxalt   Social History:Reviewed  Social History  Preferred Language: English Race:  White Ethnicity: Not Hispanic / Latino Age: [age]Years 1 Months Marital Status:  S Alcohol: no Recreational drug(s): no   Smoking Status: Never smoker reviewed on 02/02/2014 Functional Status reviewed on 02/02/2014 ------------------------------------------------ Bathing: Normal Cooking: Normal Dressing: Normal Driving: Normal Eating: Normal Managing Meds: Normal Oral Care: Normal Shopping: Normal Toileting: Normal Transferring: Normal Walking: Normal Cognitive Status reviewed on 02/02/2014 ------------------------------------------------ Attention: Normal Decision Making: Normal Language: Normal Memory: Normal Motor: Normal Perception: Normal Problem Solving: Normal Visual and  Spatial: Normal   Family History:  Reviewed  Family Health History Mother, Unknown; Healthy;  Father, Living; Healthy;     Objective Information: General:  Well appearing, well nourished in no distress. Neck:  Supple without lymphadenopathy.  Heart:  RRR, no murmur or gallop.  Normal S1, S2.  No S3, S4.  Lungs:    CTA bilaterally, no wheezes, rhonchi, rales.  Breathing unlabored.       Dominant oval 3cm mass at 1 o'clock positioon periareolar.  No nipple discharge.  Surgical scars from previous mammoplasty present.  No axillary lymphadenopathy.  Left breast unremarkable. Abdomen:          U/S and mammogram reviewed.  Assessment:Right breast neoplasm, undetermined behavior  Diagnoses: 611.72 Breast lump (Unspecified lump in breast)  Procedures: [id number]- OFFICE OUTPATIENT NEW 30 MINUTES    Plan:  Scheduled for right breast biopsy on 02/05/14.   Patient Education:Alternative treatments to surgery were discussed with patient (and family).  Risks and benefits  of procedure were fully explained to the patient (and family) who gave informed consent. Patient/family questions were addressed.  Follow-up:Pending Surgery

## 2014-02-05 ENCOUNTER — Encounter (HOSPITAL_COMMUNITY): Payer: Self-pay | Admitting: *Deleted

## 2014-02-05 ENCOUNTER — Ambulatory Visit (HOSPITAL_COMMUNITY)
Admission: RE | Admit: 2014-02-05 | Discharge: 2014-02-05 | Disposition: A | Discharge status: Home / Self Care | Payer: PRIVATE HEALTH INSURANCE | Source: Ambulatory Visit | Attending: General Surgery | Admitting: General Surgery

## 2014-02-05 ENCOUNTER — Encounter (HOSPITAL_COMMUNITY)
Admission: RE | Disposition: A | Discharge status: Home / Self Care | Payer: Self-pay | Source: Ambulatory Visit | Attending: General Surgery

## 2014-02-05 ENCOUNTER — Encounter (HOSPITAL_COMMUNITY): Payer: PRIVATE HEALTH INSURANCE | Admitting: Anesthesiology

## 2014-02-05 ENCOUNTER — Ambulatory Visit (HOSPITAL_COMMUNITY): Payer: PRIVATE HEALTH INSURANCE | Admitting: Anesthesiology

## 2014-02-05 DIAGNOSIS — N6089 Other benign mammary dysplasias of unspecified breast: Secondary | ICD-10-CM | POA: Insufficient documentation

## 2014-02-05 DIAGNOSIS — F411 Generalized anxiety disorder: Secondary | ICD-10-CM | POA: Insufficient documentation

## 2014-02-05 DIAGNOSIS — F329 Major depressive disorder, single episode, unspecified: Secondary | ICD-10-CM | POA: Insufficient documentation

## 2014-02-05 DIAGNOSIS — F3289 Other specified depressive episodes: Secondary | ICD-10-CM | POA: Insufficient documentation

## 2014-02-05 HISTORY — PX: BREAST BIOPSY: SHX20

## 2014-02-05 SURGERY — BREAST BIOPSY
Anesthesia: General | Site: Breast | Laterality: Right

## 2014-02-05 MED ORDER — LIDOCAINE HCL 1 % IJ SOLN
INTRAMUSCULAR | Status: DC | PRN
  Administered 2014-02-05: 50 mg via INTRADERMAL

## 2014-02-05 MED ORDER — PROPOFOL 10 MG/ML IV BOLUS
INTRAVENOUS | Status: DC | PRN
  Administered 2014-02-05: 140 mg via INTRAVENOUS

## 2014-02-05 MED ORDER — FENTANYL CITRATE 0.05 MG/ML IJ SOLN
INTRAMUSCULAR | Status: DC | PRN
  Administered 2014-02-05 (×2): 25 ug via INTRAVENOUS
  Administered 2014-02-05: 50 ug via INTRAVENOUS
  Administered 2014-02-05: 25 ug via INTRAVENOUS

## 2014-02-05 MED ORDER — KETOROLAC TROMETHAMINE 30 MG/ML IJ SOLN
30.0000 mg | Freq: Once | INTRAMUSCULAR | Status: AC
  Administered 2014-02-05: 30 mg via INTRAVENOUS
  Filled 2014-02-05: qty 1

## 2014-02-05 MED ORDER — FENTANYL CITRATE 0.05 MG/ML IJ SOLN: 25.0000 ug | INTRAMUSCULAR | Status: DC | PRN

## 2014-02-05 MED ORDER — MIDAZOLAM HCL 2 MG/2ML IJ SOLN
1.0000 mg | INTRAMUSCULAR | Status: DC | PRN
  Administered 2014-02-05: 2 mg via INTRAVENOUS
  Filled 2014-02-05: qty 2

## 2014-02-05 MED ORDER — MIDAZOLAM HCL 5 MG/5ML IJ SOLN
INTRAMUSCULAR | Status: DC | PRN
  Administered 2014-02-05 (×2): 2 mg via INTRAVENOUS

## 2014-02-05 MED ORDER — 0.9 % SODIUM CHLORIDE (POUR BTL) OPTIME
TOPICAL | Status: DC | PRN
  Administered 2014-02-05: 1000 mL

## 2014-02-05 MED ORDER — BUPIVACAINE HCL (PF) 0.5 % IJ SOLN
INTRAMUSCULAR | Status: AC
Start: 2014-02-05 — End: 2014-02-05
  Filled 2014-02-05: qty 30

## 2014-02-05 MED ORDER — MIDAZOLAM HCL 2 MG/2ML IJ SOLN
INTRAMUSCULAR | Status: AC
  Filled 2014-02-05: qty 2

## 2014-02-05 MED ORDER — FENTANYL CITRATE 0.05 MG/ML IJ SOLN
INTRAMUSCULAR | Status: AC
  Filled 2014-02-05: qty 2

## 2014-02-05 MED ORDER — MIDAZOLAM HCL 2 MG/2ML IJ SOLN
INTRAMUSCULAR | Status: AC
Start: 2014-02-05 — End: 2014-02-05
  Filled 2014-02-05: qty 2

## 2014-02-05 MED ORDER — ONDANSETRON HCL 4 MG/2ML IJ SOLN
4.0000 mg | Freq: Once | INTRAMUSCULAR | Status: AC
  Administered 2014-02-05: 4 mg via INTRAVENOUS
  Filled 2014-02-05: qty 2

## 2014-02-05 MED ORDER — HYDROCODONE-ACETAMINOPHEN 5-325 MG PO TABS: 1.0000 | ORAL_TABLET | Freq: Four times a day (QID) | ORAL | Status: AC | PRN

## 2014-02-05 MED ORDER — LACTATED RINGERS IV SOLN
INTRAVENOUS | Status: DC
  Administered 2014-02-05: 1000 mL via INTRAVENOUS

## 2014-02-05 MED ORDER — PROPOFOL 10 MG/ML IV EMUL
INTRAVENOUS | Status: AC
  Filled 2014-02-05: qty 20

## 2014-02-05 MED ORDER — LIDOCAINE HCL (PF) 1 % IJ SOLN
INTRAMUSCULAR | Status: AC
  Filled 2014-02-05: qty 5

## 2014-02-05 MED ORDER — FENTANYL CITRATE 0.05 MG/ML IJ SOLN
25.0000 ug | INTRAMUSCULAR | Status: AC
  Administered 2014-02-05 (×2): 25 ug via INTRAVENOUS
  Filled 2014-02-05: qty 2

## 2014-02-05 MED ORDER — GLYCOPYRROLATE 0.2 MG/ML IJ SOLN
0.2000 mg | Freq: Once | INTRAMUSCULAR | Status: AC
  Administered 2014-02-05: 0.2 mg via INTRAVENOUS
  Filled 2014-02-05: qty 1

## 2014-02-05 MED ORDER — ONDANSETRON HCL 4 MG/2ML IJ SOLN: 4.0000 mg | Freq: Once | INTRAMUSCULAR | Status: DC | PRN

## 2014-02-05 MED ORDER — BUPIVACAINE HCL (PF) 0.5 % IJ SOLN
INTRAMUSCULAR | Status: DC | PRN
  Administered 2014-02-05: 9 mL

## 2014-02-05 SURGICAL SUPPLY — 33 items
ADH SKN CLS APL DERMABOND .7 (GAUZE/BANDAGES/DRESSINGS) ×1
BAG HAMPER (MISCELLANEOUS) ×2 IMPLANT
BLADE 15 SAFETY STRL DISP (BLADE) ×2 IMPLANT
CLOTH BEACON ORANGE TIMEOUT ST (SAFETY) ×2 IMPLANT
COVER LIGHT HANDLE STERIS (MISCELLANEOUS) ×4 IMPLANT
DERMABOND ADVANCED (GAUZE/BANDAGES/DRESSINGS) ×1
DERMABOND ADVANCED .7 DNX12 (GAUZE/BANDAGES/DRESSINGS) IMPLANT
DURAPREP 26ML APPLICATOR (WOUND CARE) ×2 IMPLANT
ELECT REM PT RETURN 9FT ADLT (ELECTROSURGICAL) ×2
ELECTRODE REM PT RTRN 9FT ADLT (ELECTROSURGICAL) ×1 IMPLANT
FORMALIN 10 PREFIL 120ML (MISCELLANEOUS) ×2 IMPLANT
GLOVE BIOGEL PI IND STRL 7.0 (GLOVE) IMPLANT
GLOVE BIOGEL PI INDICATOR 7.0 (GLOVE) ×2
GLOVE ECLIPSE 6.5 STRL STRAW (GLOVE) ×1 IMPLANT
GLOVE SURG SS PI 7.5 STRL IVOR (GLOVE) ×4 IMPLANT
GOWN STRL REUS W/TWL LRG LVL3 (GOWN DISPOSABLE) ×4 IMPLANT
KIT ROOM TURNOVER APOR (KITS) ×2 IMPLANT
MANIFOLD NEPTUNE II (INSTRUMENTS) ×2 IMPLANT
NDL HYPO 18GX1.5 BLUNT FILL (NEEDLE) ×1 IMPLANT
NDL HYPO 25X1 1.5 SAFETY (NEEDLE) ×1 IMPLANT
NEEDLE HYPO 18GX1.5 BLUNT FILL (NEEDLE) ×2 IMPLANT
NEEDLE HYPO 25X1 1.5 SAFETY (NEEDLE) ×2 IMPLANT
NS IRRIG 1000ML POUR BTL (IV SOLUTION) ×2 IMPLANT
PACK MINOR (CUSTOM PROCEDURE TRAY) ×2 IMPLANT
PAD ARMBOARD 7.5X6 YLW CONV (MISCELLANEOUS) ×2 IMPLANT
SET BASIN LINEN APH (SET/KITS/TRAYS/PACK) ×2 IMPLANT
SPONGE GAUZE 2X2 8PLY STRL LF (GAUZE/BANDAGES/DRESSINGS) IMPLANT
STRIP CLOSURE SKIN 1/4X3 (GAUZE/BANDAGES/DRESSINGS) IMPLANT
SUT SILK 2 0 SH (SUTURE) ×2 IMPLANT
SUT VIC AB 3-0 SH 27 (SUTURE) ×2
SUT VIC AB 3-0 SH 27X BRD (SUTURE) ×1 IMPLANT
SUT VIC AB 4-0 PS2 27 (SUTURE) ×2 IMPLANT
SYR CONTROL 10ML LL (SYRINGE) ×2 IMPLANT

## 2014-02-05 NOTE — Anesthesia Preprocedure Evaluation (Signed)
Anesthesia Evaluation  Patient identified by MRN, date of birth, ID band Patient awake    Reviewed: Allergy & Precautions, H&P , NPO status , Patient's Chart, lab work & pertinent test results  Airway Mallampati: II TM Distance: <3 FB Neck ROM: full    Dental  (+) Teeth Intact   Pulmonary neg pulmonary ROS,  breath sounds clear to auscultation        Cardiovascular negative cardio ROS  Rhythm:regular Rate:Normal     Neuro/Psych  Headaches, PSYCHIATRIC DISORDERS Anxiety Depression  Neuromuscular disease    GI/Hepatic negative GI ROS, Neg liver ROS,   Endo/Other    Renal/GU negative Renal ROS     Musculoskeletal   Abdominal Normal abdominal exam  (+)   Peds  Hematology negative hematology ROS (+)   Anesthesia Other Findings   Reproductive/Obstetrics                           Anesthesia Physical Anesthesia Plan  ASA: II  Anesthesia Plan: General   Post-op Pain Management:    Induction: Intravenous  Airway Management Planned: LMA  Additional Equipment:   Intra-op Plan:   Post-operative Plan: Extubation in OR  Informed Consent: I have reviewed the patients History and Physical, chart, labs and discussed the procedure including the risks, benefits and alternatives for the proposed anesthesia with the patient or authorized representative who has indicated his/her understanding and acceptance.     Plan Discussed with:   Anesthesia Plan Comments:         Anesthesia Quick Evaluation

## 2014-02-05 NOTE — Anesthesia Procedure Notes (Signed)
Procedure Name: LMA Insertion Date/Time: 02/05/2014 8:56 AM Performed by: Charmaine Downs Pre-anesthesia Checklist: Patient being monitored, Suction available, Emergency Drugs available and Patient identified Patient Re-evaluated:Patient Re-evaluated prior to inductionOxygen Delivery Method: Circle system utilized Preoxygenation: Pre-oxygenation with 100% oxygen Intubation Type: IV induction LMA: LMA inserted LMA Size: 3.0 Tube type: Oral Number of attempts: 1 Placement Confirmation: positive ETCO2 and breath sounds checked- equal and bilateral Tube secured with: Tape Dental Injury: Teeth and Oropharynx as per pre-operative assessment  Comments: LMA secured with hypo-allergenic tape

## 2014-02-05 NOTE — Interval H&P Note (Signed)
History and Physical Interval Note:  02/05/2014 8:36 AM  Erica Cox Page  has presented today for surgery, with the diagnosis of right breast neoplasm  The various methods of treatment have been discussed with the patient and family. After consideration of risks, benefits and other options for treatment, the patient has consented to  Procedure(s): BREAST BIOPSY (Right) as a surgical intervention .  The patient's history has been reviewed, patient examined, no change in status, stable for surgery.  I have reviewed the patient's chart and labs.  Questions were answered to the patient's satisfaction.     [name]

## 2014-02-05 NOTE — Anesthesia Postprocedure Evaluation (Signed)
  Anesthesia Post-op Note  Patient: Erica Cox  Procedure(s) Performed: Procedure(s): BREAST BIOPSY (Right)  Patient Location: PACU  Anesthesia Type:General  Level of Consciousness: awake, alert , oriented and patient cooperative  Airway and Oxygen Therapy: Patient Spontanous Breathing  Post-op Pain: 2 /10, mild  Post-op Assessment: Post-op Vital signs reviewed, Patient's Cardiovascular Status Stable, Respiratory Function Stable, Patent Airway and Pain level controlled  Post-op Vital Signs: Reviewed and stable  Last Vitals:  Filed Vitals:   02/05/14 0845  BP: 127/92  Temp:   Resp: 51    Complications: No apparent anesthesia complications

## 2014-02-05 NOTE — Discharge Instructions (Signed)
Breast Biopsy  Care After Refer to this sheet in the next few weeks. These instructions provide you with information on caring for yourself after your procedure. Your caregiver may also give you more specific instructions. Your treatment has been planned according to current medical practices, but problems sometimes occur. Call your caregiver if you have any problems or questions after your procedure. HOME CARE INSTRUCTIONS   Only take over-the-counter or prescription medicines for pain, discomfort, or fever as directed by your caregiver.  Do not take aspirin. It can cause bleeding.  Keep stitches dry when bathing.  Protect the biopsy area. Do not let the area get bumped.  Avoid activities that may pull the incision site open until approved by your caregiver. This can include stretching, reaching, exercise, sports, or lifting over 3 pounds.  Resume your usual diet.  Wear a good support bra for as long as directed by your caregiver.  Change any bandages (dressings) as directed by your caregiver.  Do not drink alcohol while taking pain medicine.  Keep all your follow-up appointments with your caregiver. Ask when your test results will be ready. Make sure you get your test results. SEEK MEDICAL CARE IF:   You have redness, swelling, or increasing pain in the biopsy site.  You have a bad smell coming from the biopsy site or dressing.  Your biopsy site breaks open after the stitches (sutures), staples, or skin adhesive strips have been removed.  You have a rash.  You need stronger medicine. SEEK IMMEDIATE MEDICAL CARE IF:   You have a fever.  You have increased bleeding (more than a small spot) from the biopsy site.  You have difficulty breathing.  You have pus coming from the biopsy site. MAKE SURE YOU:  Understand these instructions.  Will watch your condition.  Will get help right away if you are not doing well or get worse. Document Released: 03/16/2005 Document  Revised: 11/19/2011 Document Reviewed: 09/27/2011 [facility] Patient Information 2014 [place], [place].

## 2014-02-05 NOTE — Op Note (Signed)
Patient:  Erica Cox  DOB:  08/24/1971  MRN:  [id number]   Preop Diagnosis:  Right breast neoplasm  Postop Diagnosis:  Same  Procedure:  Right breast biopsy  Surgeon:  Aviva Signs, M.D.  Anes:  General  Indications:  Patient is a 43 year old white female status post reduction mammoplasty in the past who presents with a dominant mass in the right breast. The patient now comes to the operating room for right breast biopsy. The risks and benefits of the procedure including bleeding, infection, and the possibility of malignancy were fully explained to the patient, who gave informed consent.  Procedure note:  The patient is placed the supine position. After general anesthesia was administered, the right breast was prepped and draped using usual sterile technique with DuraPrep. Surgical site confirmation was performed.  The mass was at approximately the 1 to 2:00 position in the right breast, just outside the periareolar border. An incision was made in dissection was taken down to the mass. The mass appeared to be fibrous in nature. It was fully excised without difficulty. He was sent to pathology further examination. A bleeding was controlled using Bovie electrocautery. 0.5% Sensorcaine was instilled the surrounding wound. The incision was closed using a 4 Vicryl subcuticular suture. Dermabond was applied.  All tape and needle counts were correct at the end of the procedure. Patient was awakened and transferred to PACU in stable condition.  Complications:  None  EBL:  Minimal  Specimen:  Right breast tissue

## 2014-02-05 NOTE — Transfer of Care (Signed)
Immediate Anesthesia Transfer of Care Note  Patient: Erica Cox  Procedure(s) Performed: Procedure(s): BREAST BIOPSY (Right)  Patient Location: PACU  Anesthesia Type:General  Level of Consciousness: awake, alert  and patient cooperative  Airway & Oxygen Therapy: Patient Spontanous Breathing and Patient connected to face mask oxygen  Post-op Assessment: Report given to PACU RN, Post -op Vital signs reviewed and stable and Patient moving all extremities  Post vital signs: Reviewed and stable  Complications: No apparent anesthesia complications

## 2014-03-08 ENCOUNTER — Emergency Department (HOSPITAL_COMMUNITY)
Admission: EM | Admit: 2014-03-08 | Discharge: 2014-03-08 | Disposition: A | Discharge status: Home / Self Care | Payer: PRIVATE HEALTH INSURANCE | Attending: Emergency Medicine | Admitting: Emergency Medicine

## 2014-03-08 ENCOUNTER — Emergency Department (HOSPITAL_COMMUNITY): Payer: PRIVATE HEALTH INSURANCE

## 2014-03-08 ENCOUNTER — Encounter (HOSPITAL_COMMUNITY): Payer: Self-pay | Admitting: Emergency Medicine

## 2014-03-08 DIAGNOSIS — IMO0002 Reserved for concepts with insufficient information to code with codable children: Secondary | ICD-10-CM | POA: Insufficient documentation

## 2014-03-08 DIAGNOSIS — Y92410 Unspecified street and highway as the place of occurrence of the external cause: Secondary | ICD-10-CM | POA: Insufficient documentation

## 2014-03-08 DIAGNOSIS — Z79899 Other long term (current) drug therapy: Secondary | ICD-10-CM | POA: Insufficient documentation

## 2014-03-08 DIAGNOSIS — F329 Major depressive disorder, single episode, unspecified: Secondary | ICD-10-CM | POA: Insufficient documentation

## 2014-03-08 DIAGNOSIS — S76011A Strain of muscle, fascia and tendon of right hip, initial encounter: Secondary | ICD-10-CM

## 2014-03-08 DIAGNOSIS — G43909 Migraine, unspecified, not intractable, without status migrainosus: Secondary | ICD-10-CM | POA: Insufficient documentation

## 2014-03-08 DIAGNOSIS — Z8742 Personal history of other diseases of the female genital tract: Secondary | ICD-10-CM | POA: Insufficient documentation

## 2014-03-08 DIAGNOSIS — F3289 Other specified depressive episodes: Secondary | ICD-10-CM | POA: Insufficient documentation

## 2014-03-08 DIAGNOSIS — R000 Tachycardia, unspecified: Secondary | ICD-10-CM | POA: Insufficient documentation

## 2014-03-08 DIAGNOSIS — Y9389 Activity, other specified: Secondary | ICD-10-CM | POA: Insufficient documentation

## 2014-03-08 DIAGNOSIS — Z8739 Personal history of other diseases of the musculoskeletal system and connective tissue: Secondary | ICD-10-CM | POA: Insufficient documentation

## 2014-03-08 MED ORDER — OXYCODONE-ACETAMINOPHEN 5-325 MG PO TABS: 1.0000 | ORAL_TABLET | ORAL | Status: DC | PRN

## 2014-03-08 MED ORDER — ONDANSETRON HCL 4 MG/2ML IJ SOLN
4.0000 mg | Freq: Once | INTRAMUSCULAR | Status: AC
  Administered 2014-03-08: 4 mg via INTRAVENOUS
  Filled 2014-03-08: qty 2

## 2014-03-08 MED ORDER — OXYCODONE-ACETAMINOPHEN 5-325 MG PO TABS
1.0000 | ORAL_TABLET | ORAL | Status: DC | PRN
Start: 2014-03-08 — End: 2015-05-30

## 2014-03-08 MED ORDER — HYDROMORPHONE HCL PF 1 MG/ML IJ SOLN
1.0000 mg | Freq: Once | INTRAMUSCULAR | Status: AC
  Administered 2014-03-08: 1 mg via INTRAVENOUS
  Filled 2014-03-08: qty 1
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
## 2014-03-10 MED FILL — Oxycodone w/ Acetaminophen Tab 5-325 MG: ORAL | Qty: 6 | Status: AC

## 2014-04-12 ENCOUNTER — Encounter: Payer: Self-pay | Admitting: Orthopedic Surgery

## 2014-04-12 ENCOUNTER — Ambulatory Visit: Payer: Self-pay | Admitting: Orthopedic Surgery

## 2015-01-31 ENCOUNTER — Other Ambulatory Visit: Payer: Self-pay | Admitting: Adult Health

## 2015-04-18 ENCOUNTER — Encounter: Payer: Self-pay | Admitting: Orthopedic Surgery

## 2015-04-18 ENCOUNTER — Ambulatory Visit (INDEPENDENT_AMBULATORY_CARE_PROVIDER_SITE_OTHER): Payer: PRIVATE HEALTH INSURANCE

## 2015-04-18 ENCOUNTER — Ambulatory Visit (INDEPENDENT_AMBULATORY_CARE_PROVIDER_SITE_OTHER): Payer: PRIVATE HEALTH INSURANCE | Admitting: Orthopedic Surgery

## 2015-04-18 VITALS — BP 135/96 | Ht 63.0 in | Wt 141.0 lb

## 2015-04-18 DIAGNOSIS — M5442 Lumbago with sciatica, left side: Secondary | ICD-10-CM | POA: Diagnosis not present

## 2015-04-18 MED ORDER — DICLOFENAC SODIUM 75 MG PO TBEC: 75.0000 mg | DELAYED_RELEASE_TABLET | Freq: Two times a day (BID) | ORAL | Status: DC

## 2015-04-18 MED ORDER — METHOCARBAMOL 500 MG PO TABS: 500.0000 mg | ORAL_TABLET | Freq: Four times a day (QID) | ORAL | Status: DC | PRN

## 2015-04-18 MED ORDER — GABAPENTIN 100 MG PO CAPS: 100.0000 mg | ORAL_CAPSULE | Freq: Three times a day (TID) | ORAL | Status: DC

## 2015-04-18 NOTE — Progress Notes (Signed)
Patient ID: Erica Cox, female   DOB: 05/24/1971, 44 y.o.   MRN: [id number]  New problem   Chief Complaint  Patient presents with  . Back Pain    Back pain for one week. DOI 04-09-15.     Erica Cox is a 44 y.o. female.   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 Complains of lower back pain catching stiffness some dull sharp burning pain in the lower back 8 out of 10 constant unrelieved by diclofenac.  In the past when she had the peroneal nerve problem which was documented by nerve conduction study, she responded well to Neurontin  Review of systems is negative for bowel or bladder dysfunction Complete system review was also negative Review of Systems See hpi  Past Medical History  Diagnosis Date  . Headache(784.0)     migraines  . Depression   . Anxiety   . Macromastia   . Breast nodule 12/14/2013    Will get diagnostic mammogram and right [place] if needed  . SUI (stress urinary incontinence, female) 12/14/2013  . Sciatic pain     Past Surgical History  Procedure Laterality Date  . Cholecystectomy    . Orif distal radius fracture  04/28/2010    left  . Breast reduction surgery  08/28/2011    Procedure: MAMMARY REDUCTION BILATERAL (BREAST);  Surgeon: Mary A Contogiannis;  Location: [place];  Service: Plastics;  Laterality: Bilateral;  . Breast biopsy Right 02/05/2014    Procedure: BREAST BIOPSY;  Surgeon: [name], MD;  Location: [facility];  Service: General;  Laterality: Right;    Family History  Problem Relation Age of Onset  . Diabetes Father   . Hypertension Father   . Hypertension Mother   . Vision loss Brother   . Cancer Paternal Grandmother     breast  . Emphysema Paternal Grandmother   . Asthma Paternal Grandmother   . COPD Paternal Grandmother   . Heart disease Paternal Grandfather   . Heart attack Paternal Grandfather      Social History History  Substance Use Topics  . Smoking status: Never Smoker   . Smokeless tobacco: Never Used  . Alcohol Use: No    Allergies  Allergen Reactions  . Adhesive [Tape] Hives    Current Outpatient Prescriptions  Medication Sig Dispense Refill  . diclofenac (VOLTAREN) 75 MG EC tablet Take 1 tablet (75 mg total) by mouth 2 (two) times daily with a meal. 60 tablet 2  . Fenofibrate Micronized (ANTARA) 90 MG CAPS Take 1 capsule by mouth daily.    Marland Kitchen gabapentin (NEURONTIN) 100 MG capsule Take 1 capsule (100 mg total) by mouth 3 (three) times daily. 90 capsule 1  . Levomilnacipran HCl ER (FETZIMA) 80 MG CP24 Take 1 tablet by mouth daily.    . LO LOESTRIN FE 1 MG-10 MCG / 10 MCG tablet TAKE 1 TABLET BY MOUTH DAILY 84 tablet PRN  . methocarbamol (ROBAXIN) 500 MG tablet Take 1 tablet (500 mg total) by mouth every 6 (six) hours as needed for muscle spasms. 56 tablet 2  . omeprazole (PRILOSEC) 20 MG capsule Take 20 mg by mouth daily.    Marland Kitchen oxyCODONE-acetaminophen (PERCOCET/ROXICET) 5-325 MG per tablet Take 1 tablet by mouth every 4 (four) hours as needed. 6 tablet 0  . oxyCODONE-acetaminophen (PERCOCET/ROXICET) 5-325 MG per tablet Take 1 tablet by mouth  every 4 (four) hours as needed. 20 tablet 0  . rizatriptan (MAXALT) 10 MG tablet Take 10 mg by mouth as needed for migraine. May repeat in 2 hours if needed    . SUMAtriptan (IMITREX) 100 MG tablet Take 100 mg by mouth every 2 (two) hours as needed for migraine or headache. May repeat in 2 hours if headache persists or recurs.    . topiramate (TOPAMAX) 100 MG tablet Take 200 mg by mouth daily. PM     No current facility-administered medications for this visit.       Physical Exam Blood pressure 135/96, height 5' 3"  (1.6 m), weight 141 lb (63.957 kg). Physical Exam The patient is well developed well nourished and well groomed. Orientation to person place and time is normal  Mood is pleasant. Ambulatory status he has normal  ambulatory pattern  She has tenderness in the lower back and the left side of the back she flexes without pain has some pain with extension. She has a negative straight leg raise on the right slightly positive on the left negative Lasegue's sign on the left normal reflexes in both knees normal sensation in both legs normal pulses in both feet normal motor exam in both lower extremities     Data Reviewed Lumbar spine films today show spinal asymmetry on AP and lateral views with loss of lumbar lordosis and normal AP alignment no fracture dislocation or displaced narrowing  Assessment Encounter Diagnosis  Name Primary?  . Left-sided low back pain with left-sided sciatica Yes    Plan  Meds ordered this encounter  Medications  . diclofenac (VOLTAREN) 75 MG EC tablet    Sig: Take 1 tablet (75 mg total) by mouth 2 (two) times daily with a meal.    Dispense:  60 tablet    Refill:  2  . methocarbamol (ROBAXIN) 500 MG tablet    Sig: Take 1 tablet (500 mg total) by mouth every 6 (six) hours as needed for muscle spasms.    Dispense:  56 tablet    Refill:  2  . gabapentin (NEURONTIN) 100 MG capsule    Sig: Take 1 capsule (100 mg total) by mouth 3 (three) times daily.    Dispense:  90 capsule    Refill:  1   See her in 6 weeks if necessary MRI should be repeated

## 2015-05-30 ENCOUNTER — Ambulatory Visit (INDEPENDENT_AMBULATORY_CARE_PROVIDER_SITE_OTHER): Payer: PRIVATE HEALTH INSURANCE | Admitting: Orthopedic Surgery

## 2015-05-30 VITALS — BP 135/95 | Ht 63.0 in | Wt 142.0 lb

## 2015-05-30 DIAGNOSIS — M5442 Lumbago with sciatica, left side: Secondary | ICD-10-CM

## 2015-05-30 MED ORDER — GABAPENTIN 100 MG PO CAPS: 100.0000 mg | ORAL_CAPSULE | Freq: Three times a day (TID) | ORAL | Status: DC

## 2015-05-30 NOTE — Progress Notes (Signed)
Patient ID: Erica Cox Page, female   DOB: [birth year], 44 y.o.   MRN: [id number]  Follow up visit  Chief Complaint  Patient presents with  . Follow-up    6 week follow up left sided back pain s/p medication    BP 135/95 mmHg  Ht 5' 3"  (1.6 m)  Wt 142 lb (64.411 kg)  BMI 25.16 kg/m2  Encounter Diagnosis  Name Primary?  . Left-sided low back pain with left-sided sciatica Yes    This patient was treated for back pain with sciatica and we put her on gabapentin which seems to be doing very well except it is making her appetite increased. We also put her on Robaxin and Voltaren. Currently she is just needing the gabapentin and she takes it intermittently  She still has some mild ache in her back which she notices when lying recumbent for lying on her stomach and with certain activities  Review of systems no numbness or tingling and no bowel or bladder problems.  Exam shows stable vital signs she is awake alert and oriented 3 mood and affect are normal she has normal strength in both lower extremities with equal reflexes at 2+ at the knee and ankle. Toes are downgoing sensation tests are normal bilaterally she has good pulses and bilateral negative straight leg raises  Plan continue gabapentin  Return in 3 months

## 2015-07-04 ENCOUNTER — Other Ambulatory Visit: Payer: Self-pay | Admitting: *Deleted

## 2015-07-04 MED ORDER — NORETHIN-ETH ESTRAD-FE BIPHAS 1 MG-10 MCG / 10 MCG PO TABS: 1.0000 | ORAL_TABLET | Freq: Every day | ORAL | Status: DC

## 2015-08-29 ENCOUNTER — Ambulatory Visit (INDEPENDENT_AMBULATORY_CARE_PROVIDER_SITE_OTHER): Payer: PRIVATE HEALTH INSURANCE | Admitting: Orthopedic Surgery

## 2015-08-29 VITALS — BP 137/100 | Ht 63.0 in | Wt 145.0 lb

## 2015-08-29 DIAGNOSIS — M5442 Lumbago with sciatica, left side: Secondary | ICD-10-CM

## 2015-08-29 DIAGNOSIS — M542 Cervicalgia: Secondary | ICD-10-CM

## 2015-08-29 NOTE — Patient Instructions (Signed)
Stay on [place]

## 2015-08-29 NOTE — Progress Notes (Signed)
Routine follow-up  Patient came to [place] with left-sided lower back pain and left-sided sciatica which is responded well to gabapentin, no complaints at this time  New complaint pain right sided cervical spine and trapezius muscles with stiffness and discomfort mild in severity dull ache intermittent present for several weeks off and on  Review of systems planes of right sided cervical spine pain and stiffness  Denies numbness or tingling in the upper extremities denies weakness in the upper extremities  Bowel bladder function intact  Past Medical History  Diagnosis Date  . Headache(784.0)     migraines  . Depression   . Anxiety   . Macromastia   . Breast nodule 12/14/2013    Will get diagnostic mammogram and right [place] if needed  . SUI (stress urinary incontinence, female) 12/14/2013  . Sciatic pain     BP 137/100 mmHg  Ht 5' 3"  (1.6 m)  Wt 145 lb (65.772 kg)  BMI 25.69 kg/m2 She is awake alert and oriented 3 her mood affect normal  She is walking without any assistive devices, no limping is noted. Examination of the cervical spine shows full range of motion tenderness in the right trapezius muscle negative Spurling sign equal grip strength is normal 2+ reflexes at the elbow  Hip range of motion normal straight leg raises  negative him a leg length intact normal muscle bulk in both lower extremities. 2+ reflexes at the knee in both extremities. Both hips are stable.  Skin normal cervical spine shoulders and lower extremities  Stable sciatica left, recommend continue gabapentin Right sided cervical spine discomfort recommend muscle cream, rub, heat  Follow-up 6 months

## 2015-11-04 ENCOUNTER — Other Ambulatory Visit: Payer: PRIVATE HEALTH INSURANCE | Admitting: Adult Health

## 2015-11-11 ENCOUNTER — Encounter: Payer: Self-pay | Admitting: Adult Health

## 2015-11-11 ENCOUNTER — Ambulatory Visit (INDEPENDENT_AMBULATORY_CARE_PROVIDER_SITE_OTHER): Payer: PRIVATE HEALTH INSURANCE | Admitting: Adult Health

## 2015-11-11 ENCOUNTER — Other Ambulatory Visit (HOSPITAL_COMMUNITY)
Admission: RE | Admit: 2015-11-11 | Discharge: 2015-11-11 | Disposition: A | Discharge status: Home / Self Care | Payer: PRIVATE HEALTH INSURANCE | Source: Ambulatory Visit | Attending: Adult Health | Admitting: Adult Health

## 2015-11-11 VITALS — BP 130/110 | HR 88 | Ht 62.75 in | Wt 133.0 lb

## 2015-11-11 DIAGNOSIS — Z01419 Encounter for gynecological examination (general) (routine) without abnormal findings: Secondary | ICD-10-CM | POA: Diagnosis not present

## 2015-11-11 DIAGNOSIS — Z1151 Encounter for screening for human papillomavirus (HPV): Secondary | ICD-10-CM | POA: Insufficient documentation

## 2015-11-11 DIAGNOSIS — Z3041 Encounter for surveillance of contraceptive pills: Secondary | ICD-10-CM

## 2015-11-11 DIAGNOSIS — Z113 Encounter for screening for infections with a predominantly sexual mode of transmission: Secondary | ICD-10-CM | POA: Insufficient documentation

## 2015-11-11 DIAGNOSIS — Z1211 Encounter for screening for malignant neoplasm of colon: Secondary | ICD-10-CM

## 2015-11-11 DIAGNOSIS — Z01411 Encounter for gynecological examination (general) (routine) with abnormal findings: Secondary | ICD-10-CM | POA: Insufficient documentation

## 2015-11-11 DIAGNOSIS — I10 Essential (primary) hypertension: Secondary | ICD-10-CM | POA: Diagnosis not present

## 2015-11-11 DIAGNOSIS — Z309 Encounter for contraceptive management, unspecified: Secondary | ICD-10-CM

## 2015-11-11 HISTORY — DX: Encounter for contraceptive management, unspecified: Z30.9

## 2015-11-11 HISTORY — DX: Essential (primary) hypertension: I10

## 2015-11-11 LAB — HEMOCCULT GUIAC POC 1CARD (OFFICE): Fecal Occult Blood, POC: NEGATIVE

## 2015-11-11 MED ORDER — LISINOPRIL 10 MG PO TABS: 10.0000 mg | ORAL_TABLET | Freq: Every day | ORAL | Status: DC

## 2015-11-11 NOTE — Progress Notes (Signed)
Patient ID: Erica Cox Page, female   DOB: [birth year], 45 y.o.   MRN: [id number] History of Present Illness: Erica Cox is a 45 year old white female, divorced in for a well woman gyn exam and pap.She seems anxious today. PCP is [name].She said she had labs in his office in December and they were concerned about her kidneys and she is on adipex for weight loss and to increase water intake.She works from home and helps care for her Dad.  Current Medications, Allergies, Past Medical History, Past Surgical History, Family History and Social History were reviewed in [employer] record.     Review of Systems: Patient denies any daily headaches, hearing loss, fatigue, blurred vision, shortness of breath, chest pain, abdominal pain, problems with bowel movements, urination, or intercourse. No joint pain or mood swings.See HPI for positives.    Physical Exam:BP 130/110 mmHg  Pulse 88  Ht 5' 2.75" (1.594 m)  Wt 133 lb (60.328 kg)  BMI 23.74 kg/m2  BP was 160 /110 on arrival General:  Well developed, well nourished, no acute distress Skin:  Warm and dry Neck:  Midline trachea, normal thyroid, good ROM, no lymphadenopathy Lungs; Clear to auscultation bilaterally Breast:  No dominant palpable mass, retraction, or nipple discharge, she is sp breast reduction. Cardiovascular: Regular rate and rhythm Abdomen:  Soft, non tender, no hepatosplenomegaly Pelvic:  External genitalia is normal in appearance, no lesions.  The vagina is normal in appearance. Urethra has no lesions or masses. The cervix is smooth, pap with HPV and GC/CHL performed.  Uterus is felt to be normal size, shape, and contour.  No adnexal masses or tenderness noted.Bladder is non tender, no masses felt. Rectal: Good sphincter tone, no polyps, or hemorrhoids felt.  Hemoccult negative. Extremities/musculoskeletal:  No swelling or varicosities noted, no clubbing or cyanosis Psych:  No mood changes, alert and  cooperative,seems happy   Impression: Well woman gyn exam and pap Contraceptive management Hypertension     Plan: Stop adipex Rx lisinopril 10 mg #30 take 1 daily with 1 refill Follow up with [name] next week, and I called his office, but could not speak with him at that time, will sent note. Physical in 1 year, pap in 3 if normal Get mammogram now and yearly

## 2015-11-11 NOTE — Patient Instructions (Signed)
Physical in 1 year Pap in 3 if normal Get mammogram now and yearly Stop adipex Start lisinopril 10 1 mg daily Make appt with [name] next week

## 2015-11-15 LAB — CYTOLOGY - PAP

## 2015-11-16 ENCOUNTER — Telehealth: Payer: Self-pay | Admitting: Adult Health

## 2015-11-16 NOTE — Telephone Encounter (Signed)
Pt aware pap LSIL and +HPV, GC/CHL negative, needs colpo

## 2015-11-22 ENCOUNTER — Encounter: Payer: Self-pay | Admitting: Obstetrics and Gynecology

## 2015-11-22 ENCOUNTER — Ambulatory Visit (INDEPENDENT_AMBULATORY_CARE_PROVIDER_SITE_OTHER): Payer: PRIVATE HEALTH INSURANCE | Admitting: Obstetrics and Gynecology

## 2015-11-22 ENCOUNTER — Other Ambulatory Visit: Payer: Self-pay | Admitting: Obstetrics and Gynecology

## 2015-11-22 VITALS — BP 140/90 | Ht 63.0 in | Wt 132.0 lb

## 2015-11-22 DIAGNOSIS — N870 Mild cervical dysplasia: Secondary | ICD-10-CM | POA: Diagnosis not present

## 2015-11-22 DIAGNOSIS — Z3200 Encounter for pregnancy test, result unknown: Secondary | ICD-10-CM

## 2015-11-22 DIAGNOSIS — Z3202 Encounter for pregnancy test, result negative: Secondary | ICD-10-CM | POA: Diagnosis not present

## 2015-11-22 HISTORY — PX: OTHER SURGICAL HISTORY: SHX169

## 2015-11-22 LAB — POCT URINE PREGNANCY: Preg Test, Ur: NEGATIVE

## 2015-11-22 NOTE — Addendum Note (Signed)
Addended by: Farley Ly on: 11/22/2015 04:58 PM   Modules accepted: Orders

## 2015-11-22 NOTE — Addendum Note (Signed)
Addended by: Farley Ly on: 11/22/2015 04:52 PM   Modules accepted: Orders

## 2015-11-22 NOTE — Progress Notes (Signed)
Patient ID: Tereka Thorley Page, female   DOB: 01/01/1971, 45 y.o.   MRN: [id number]  Bowie Doiron Page 45 y.o. G0P0 here for colposcopy for low-grade squamous intraepithelial neoplasia (LGSIL - encompassing HPV,mild dysplasia,CIN I) pap smear on 2017.  Discussed role for HPV in cervical dysplasia, need for surveillance.  Patient given informed consent, signed copy in the chart, time out was performed.  Placed in lithotomy position. Cervix viewed with speculum and colposcope after application of acetic acid.   Colposcopy adequate? Yes  no visible lesions; biopsies obtained at 10. 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   ECC specimen obtained.no  Very everted cervix All specimens were labelled and sent to pathology.  Colposcopy IMPRESSION: no visible dysplasia, pill hyperplasia, everted cervix  Patient was given post procedure instructions. Will follow up pathology and manage accordingly.  Routine preventative health maintenance measures emphasized.

## 2015-12-26 ENCOUNTER — Telehealth: Payer: Self-pay | Admitting: Adult Health

## 2015-12-26 NOTE — Telephone Encounter (Signed)
Spoke with pt. Pt had a biopsy on 11/22/15. Since biopsy, pt has bleeding during sex. Please advise. Thanks!! [place]

## 2015-12-26 NOTE — Telephone Encounter (Signed)
Pt called stating that She has a biopsy done and she is still bleeding from the Biopsy. Please contact pt

## 2015-12-26 NOTE — Telephone Encounter (Signed)
Spoke with pt letting her know she needs to schedule an appt with JAG. Pt voiced understanding and call was transferred to front desk for appt. [place]

## 2015-12-28 ENCOUNTER — Encounter: Payer: Self-pay | Admitting: Adult Health

## 2015-12-28 ENCOUNTER — Ambulatory Visit (INDEPENDENT_AMBULATORY_CARE_PROVIDER_SITE_OTHER): Payer: PRIVATE HEALTH INSURANCE | Admitting: Adult Health

## 2015-12-28 VITALS — BP 128/80 | HR 92 | Ht 62.0 in | Wt 135.0 lb

## 2015-12-28 DIAGNOSIS — R05 Cough: Secondary | ICD-10-CM

## 2015-12-28 DIAGNOSIS — N86 Erosion and ectropion of cervix uteri: Secondary | ICD-10-CM

## 2015-12-28 DIAGNOSIS — I10 Essential (primary) hypertension: Secondary | ICD-10-CM

## 2015-12-28 DIAGNOSIS — R059 Cough, unspecified: Secondary | ICD-10-CM | POA: Insufficient documentation

## 2015-12-28 DIAGNOSIS — N930 Postcoital and contact bleeding: Secondary | ICD-10-CM

## 2015-12-28 DIAGNOSIS — Z638 Other specified problems related to primary support group: Secondary | ICD-10-CM | POA: Insufficient documentation

## 2015-12-28 DIAGNOSIS — J301 Allergic rhinitis due to pollen: Secondary | ICD-10-CM

## 2015-12-28 DIAGNOSIS — Z9109 Other allergy status, other than to drugs and biological substances: Secondary | ICD-10-CM

## 2015-12-28 HISTORY — DX: Cough, unspecified: R05.9

## 2015-12-28 HISTORY — DX: Other allergy status, other than to drugs and biological substances: Z91.09

## 2015-12-28 HISTORY — DX: Postcoital and contact bleeding: N93.0

## 2015-12-28 HISTORY — DX: Erosion and ectropion of cervix uteri: N86

## 2015-12-28 MED ORDER — METRONIDAZOLE 0.75 % VA GEL: 1.0000 | Freq: Two times a day (BID) | VAGINAL | Status: DC

## 2015-12-28 MED ORDER — BENZONATATE 100 MG PO CAPS: 100.0000 mg | ORAL_CAPSULE | Freq: Three times a day (TID) | ORAL | Status: DC | PRN

## 2015-12-28 NOTE — Patient Instructions (Addendum)
Use metrogel no sex for 2 weeks Try tessalon perles for cough Follow up prn Follow up with PCP about BP
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
## 2016-01-05 ENCOUNTER — Encounter (HOSPITAL_COMMUNITY): Payer: Self-pay | Admitting: Emergency Medicine

## 2016-01-05 ENCOUNTER — Emergency Department (HOSPITAL_COMMUNITY)
Admission: EM | Admit: 2016-01-05 | Discharge: 2016-01-06 | Disposition: A | Discharge status: Home / Self Care | Payer: PRIVATE HEALTH INSURANCE | Attending: Emergency Medicine | Admitting: Emergency Medicine

## 2016-01-05 DIAGNOSIS — N201 Calculus of ureter: Secondary | ICD-10-CM

## 2016-01-05 DIAGNOSIS — F329 Major depressive disorder, single episode, unspecified: Secondary | ICD-10-CM | POA: Insufficient documentation

## 2016-01-05 DIAGNOSIS — R109 Unspecified abdominal pain: Secondary | ICD-10-CM | POA: Diagnosis present

## 2016-01-05 DIAGNOSIS — Z87442 Personal history of urinary calculi: Secondary | ICD-10-CM | POA: Insufficient documentation

## 2016-01-05 DIAGNOSIS — I10 Essential (primary) hypertension: Secondary | ICD-10-CM | POA: Diagnosis not present

## 2016-01-05 LAB — URINALYSIS, ROUTINE W REFLEX MICROSCOPIC
BILIRUBIN URINE: NEGATIVE
Glucose, UA: NEGATIVE mg/dL
Hgb urine dipstick: NEGATIVE
KETONES UR: NEGATIVE mg/dL
Leukocytes, UA: NEGATIVE
Nitrite: NEGATIVE
PROTEIN: NEGATIVE mg/dL
Specific Gravity, Urine: 1.025 (ref 1.005–1.030)
pH: 6 (ref 5.0–8.0)

## 2016-01-05 LAB — POC URINE PREG, ED: PREG TEST UR: NEGATIVE

## 2016-01-05 MED ORDER — HYDROMORPHONE HCL 1 MG/ML IJ SOLN
1.0000 mg | Freq: Once | INTRAMUSCULAR | Status: AC
  Administered 2016-01-05: 1 mg via INTRAVENOUS
  Filled 2016-01-05: qty 1

## 2016-01-05 MED ORDER — SODIUM CHLORIDE 0.9 % IV BOLUS (SEPSIS)
1000.0000 mL | Freq: Once | INTRAVENOUS | Status: AC
  Administered 2016-01-05: 1000 mL via INTRAVENOUS

## 2016-01-05 MED ORDER — ONDANSETRON HCL 4 MG/2ML IJ SOLN
4.0000 mg | Freq: Once | INTRAMUSCULAR | Status: AC
  Administered 2016-01-05: 4 mg via INTRAVENOUS
  Filled 2016-01-05: qty 2

## 2016-01-05 NOTE — ED Notes (Signed)
Patient complaining of left flank pain starting approximately 1 hour ago. Also complaining of urinary urgency but unable to void.

## 2016-01-05 NOTE — ED Provider Notes (Signed)
CSN: [id number]     Arrival date & time 01/05/16  2245 History  By signing my name below, I, Eustaquio Maize, attest that this documentation has been prepared under the direction and in the presence of Ripley Fraise, MD. Electronically Signed: Eustaquio Maize, ED Scribe. 01/05/2016. 11:09 PM.   Chief Complaint  Patient presents with  . Flank Pain   Patient is a 45 y.o. female presenting with flank pain. The history is provided by the patient. No language interpreter was used.  Flank Pain This is a new problem. The current episode started 1 to 2 hours ago. The problem occurs rarely. The problem has not changed since onset.Pertinent negatives include no chest pain, no abdominal pain, no headaches and no shortness of breath. Nothing aggravates the symptoms. Nothing relieves the symptoms. She has tried nothing for the symptoms. The treatment provided no relief.     HPI Comments: Erica Cox is a 45 y.o. female who presents to the Emergency Department complaining of sudden onset, constant, left flank pain that began earlier tonight. Pt also complains of nausea and urinary urgency. She has hx of kidney stones and states the pain feels similar. Denies fever, vomiting, or any other associated symptoms.   Past Medical History  Diagnosis Date  . Headache(784.0)     migraines  . Depression   . Anxiety   . Macromastia   . Breast nodule 12/14/2013    Will get diagnostic mammogram and right [place] if needed  . SUI (stress urinary incontinence, female) 12/14/2013  . Sciatic pain   . Hypertension 11/11/2015  . Contraceptive management 11/11/2015  . Vaginal Pap smear, abnormal   . Postcoital bleeding 12/28/2015  . Cough 12/28/2015  . Pollen allergies 12/28/2015  . Eversion of cervix 12/28/2015   Past Surgical History  Procedure Laterality Date  . Cholecystectomy    . Orif distal radius fracture  04/28/2010    left  . Breast reduction surgery  08/28/2011    Procedure: MAMMARY REDUCTION BILATERAL (BREAST);   Surgeon: Mary A Contogiannis;  Location: [facility];  Service: Plastics;  Laterality: Bilateral;  . Breast biopsy Right 02/05/2014    Procedure: BREAST BIOPSY;  Surgeon: [name], MD;  Location: [facility];  Service: General;  Laterality: Right;  . Colpo  11/22/2015    with biopsy   Family History  Problem Relation Age of Onset  . Diabetes Father   . Hypertension Father   . Hypertension Mother   . Vision loss Brother   . Cancer Paternal Grandmother     breast  . Emphysema Paternal Grandmother   . Asthma Paternal Grandmother   . COPD Paternal Grandmother   . Heart disease Paternal Grandfather   . Heart attack Paternal Grandfather    Social History  Substance Use Topics  . Smoking status: Never Smoker   . Smokeless tobacco: Never Used  . Alcohol Use: Yes     Comment: occ   OB History    Gravida Para Term Preterm AB TAB SAB Ectopic Multiple Living            0     Review of Systems  Constitutional: Negative for fever.  Respiratory: Negative for shortness of breath.   Cardiovascular: Negative for chest pain.  Gastrointestinal: Positive for nausea. Negative for vomiting and abdominal pain.  Genitourinary: Positive for urgency and flank pain.  Neurological: Negative for headaches.  All other systems reviewed and are negative.  Allergies  Adhesive  Home Medications  Prior to Admission medications   Medication Sig Start Date End Date Taking? Authorizing Provider  ALPRAZolam Duanne Moron) 1 MG tablet Take 1 mg by mouth at bedtime as needed for anxiety.    Historical Provider, MD  benzonatate (TESSALON) 100 MG capsule Take 1 capsule (100 mg total) by mouth 3 (three) times daily as needed for cough. 12/28/15   Estill Dooms, NP  Cetirizine HCl (ZYRTEC ALLERGY PO) Take by mouth as needed.    Historical Provider, MD  diclofenac (VOLTAREN) 75 MG EC tablet Take 75 mg by mouth as needed.  11/28/15   Historical Provider, MD  Fenofibrate Micronized (ANTARA) 90 MG CAPS  Take 1 capsule by mouth daily.    Historical Provider, MD  gabapentin (NEURONTIN) 100 MG capsule Take 1 capsule (100 mg total) by mouth 3 (three) times daily. 05/30/15   Carole Civil, MD  Levomilnacipran HCl ER (FETZIMA) 80 MG CP24 Take 1 tablet by mouth daily.    Historical Provider, MD  metroNIDAZOLE (METROGEL VAGINAL) 0.75 % vaginal gel Place 1 Applicatorful vaginally 2 (two) times daily. 12/28/15   Estill Dooms, NP  Norethindrone-Ethinyl Estradiol-Fe Biphas (LO LOESTRIN FE) 1 MG-10 MCG / 10 MCG tablet Take 1 tablet by mouth daily. 07/04/15   Estill Dooms, NP  omeprazole (PRILOSEC) 20 MG capsule Take 20 mg by mouth daily.    Historical Provider, MD  phentermine (ADIPEX-P) 37.5 MG tablet 37.5 mg daily before breakfast.  11/04/15   Historical Provider, MD  rizatriptan (MAXALT) 10 MG tablet Take 10 mg by mouth as needed for migraine. May repeat in 2 hours if needed    Historical Provider, MD  SUMAtriptan (IMITREX) 100 MG tablet Take 100 mg by mouth every 2 (two) hours as needed for migraine or headache. May repeat in 2 hours if headache persists or recurs.    Historical Provider, MD  topiramate (TOPAMAX) 100 MG tablet Take 200-300 mg by mouth daily. PM    Historical Provider, MD   BP 134/83 mmHg  Pulse 90  Temp(Src) 97.8 F (36.6 C) (Oral)  Resp 20  Ht 5' 3"  (1.6 m)  Wt 132 lb (59.875 kg)  BMI 23.39 kg/m2  SpO2 100%   Physical Exam  Nursing note and vitals reviewed.   CONSTITUTIONAL: Well developed/well nourished, anxious, uncomfortable appearing HEAD: Normocephalic/atraumatic EYES: EOMI ENMT: Mucous membranes moist NECK: supple no meningeal signs SPINE/BACK:entire spine nontender CV: S1/S2 noted, no murmurs/rubs/gallops noted LUNGS: Lungs are clear to auscultation bilaterally, no apparent distress ABDOMEN: soft, nontender, no rebound or guarding, bowel sounds noted throughout abdomen RX:VQMG cva tenderness NEURO: Pt is awake/alert/appropriate, moves all  extremitiesx4.   EXTREMITIES: full ROM SKIN: warm, color normal PSYCH: anxious  ED Course  Procedures   DIAGNOSTIC STUDIES: Oxygen Saturation is 100% on RA, normal by my interpretation.    COORDINATION OF CARE: 11:08 PM-Discussed treatment plan with pt at bedside and pt agreed to plan.   Labs Review Labs Reviewed  BASIC METABOLIC PANEL - Abnormal; Notable for the following:    Potassium 3.4 (*)    Chloride 112 (*)    CO2 19 (*)    Glucose, Bld 135 (*)    BUN 24 (*)    Creatinine, Ser 1.20 (*)    Calcium 8.0 (*)    GFR calc non Af Amer 54 (*)    All other components within normal limits  URINALYSIS, ROUTINE W REFLEX MICROSCOPIC (NOT AT [facility])  CBC WITH DIFFERENTIAL/PLATELET  POC URINE PREG, ED  Imaging Review Ct Renal Stone Study  01/06/2016  CLINICAL DATA:  Flank pain EXAM: CT ABDOMEN AND PELVIS WITHOUT CONTRAST TECHNIQUE: Multidetector CT imaging of the abdomen and pelvis was performed following the standard protocol without IV contrast. COMPARISON:  01/15/2012 FINDINGS: Lower chest and abdominal wall:  No contributory findings. Hepatobiliary: Hepatic steatosis and caudate lobe cyst. Cholecystectomy with negative common bile duct. Pancreas: Unremarkable. Spleen: Unremarkable. Adrenals/Urinary Tract:  Negative adrenals. Punctate (1 mm) stone in the far distal left ureter, best seen on coronal reformats 3:52, within 1 cm of the UVJ, with mild hydronephrosis and asymmetric left perinephric edema and renal expansion. No additional stone is seen. Unremarkable bladder. Reproductive:No pathologic findings. Stomach/Bowel: No obstruction. Uncomplicated duodenal diverticula. No appendicitis. Vascular/Lymphatic: No acute vascular abnormality. No mass or adenopathy. Peritoneal: No ascites or pneumoperitoneum. Musculoskeletal: Negative. IMPRESSION: 1. 1 mm distal left ureteral calculus with mild obstructive changes. 2. Hepatic steatosis. Electronically Signed   By: Monte Fantasia M.D.   On:  01/06/2016 01:40   I have personally reviewed and evaluated these  lab results as part of my medical decision-making.  Medications  HYDROmorphone (DILAUDID) injection 1 mg (1 mg Intravenous Given 01/05/16 2319)  ondansetron (ZOFRAN) injection 4 mg (4 mg Intravenous Given 01/05/16 2317)  sodium chloride 0.9 % bolus 1,000 mL (0 mLs Intravenous Stopped 01/06/16 0050)  HYDROmorphone (DILAUDID) injection 1 mg (1 mg Intravenous Given 01/05/16 2353)  HYDROmorphone (DILAUDID) injection 1 mg (1 mg Intravenous Given 01/06/16 0046)  HYDROmorphone (DILAUDID) injection 0.5 mg (0.5 mg Intravenous Given 01/06/16 0128)  ketorolac (TORADOL) 30 MG/ML injection 15 mg (15 mg Intravenous Given 01/06/16 0158)  ondansetron (ZOFRAN) injection 4 mg (4 mg Intravenous Given 01/06/16 0312)   Pt with acute onset of left flank pain No signs of UTI CT scans reveals distal LEFT ureteral stone She is also dehydrated Pt improved after multiple rounds of pain meds She is tolerating PO fluids She is appropriate for d/c home We discussed strict return precautions BP 111/87 mmHg  Pulse 80  Temp(Src) 97.8 F (36.6 C) (Oral)  Resp 16  Ht 5' 3"  (1.6 m)  Wt 59.875 kg  BMI 23.39 kg/m2  SpO2 97%   MDM   Final diagnoses:  Left ureteral stone    Nursing notes including past medical history and social history reviewed and considered in documentation Labs/vital reviewed myself and considered during evaluation  I personally performed the services described in this documentation, which was scribed in my presence. The recorded information has been reviewed and is accurate.         Ripley Fraise, MD 01/06/16 [phone]

## 2016-01-06 ENCOUNTER — Emergency Department (HOSPITAL_COMMUNITY): Payer: PRIVATE HEALTH INSURANCE

## 2016-01-06 LAB — BASIC METABOLIC PANEL
Anion gap: 9 (ref 5–15)
BUN: 24 mg/dL — ABNORMAL HIGH (ref 6–20)
CALCIUM: 8 mg/dL — AB (ref 8.9–10.3)
CO2: 19 mmol/L — ABNORMAL LOW (ref 22–32)
CREATININE: 1.2 mg/dL — AB (ref 0.44–1.00)
Chloride: 112 mmol/L — ABNORMAL HIGH (ref 101–111)
GFR calc Af Amer: >60 mL/min (ref >60)
GFR, EST NON AFRICAN AMERICAN: 54 mL/min — AB (ref >60)
GLUCOSE: 135 mg/dL — AB (ref 65–99)
Potassium: 3.4 mmol/L — ABNORMAL LOW (ref 3.5–5.1)
Sodium: 140 mmol/L (ref 135–145)

## 2016-01-06 LAB — CBC WITH DIFFERENTIAL/PLATELET
BASOS PCT: 1 %
Basophils Absolute: 0.1 10*3/uL (ref 0.0–0.1)
EOS PCT: 2 %
Eosinophils Absolute: 0.2 10*3/uL (ref 0.0–0.7)
HEMATOCRIT: 38.6 % (ref 36.0–46.0)
Hemoglobin: 13 g/dL (ref 12.0–15.0)
LYMPHS PCT: 19 %
Lymphs Abs: 1.8 10*3/uL (ref 0.7–4.0)
MCH: 30.4 pg (ref 26.0–34.0)
MCHC: 33.7 g/dL (ref 30.0–36.0)
MCV: 90.4 fL (ref 78.0–100.0)
Monocytes Absolute: 0.5 10*3/uL (ref 0.1–1.0)
Monocytes Relative: 5 %
Neutro Abs: 6.8 10*3/uL (ref 1.7–7.7)
Neutrophils Relative %: 73 %
Platelets: 281 10*3/uL (ref 150–400)
RBC: 4.27 MIL/uL (ref 3.87–5.11)
RDW: 12.7 % (ref 11.5–15.5)
WBC: 9.3 10*3/uL (ref 4.0–10.5)

## 2016-01-06 MED ORDER — ONDANSETRON 8 MG PO TBDP: 8.0000 mg | ORAL_TABLET | Freq: Three times a day (TID) | ORAL | Status: DC | PRN

## 2016-01-06 MED ORDER — IBUPROFEN 600 MG PO TABS: 600.0000 mg | ORAL_TABLET | Freq: Three times a day (TID) | ORAL | Status: DC | PRN

## 2016-01-06 MED ORDER — KETOROLAC TROMETHAMINE 30 MG/ML IJ SOLN
15.0000 mg | Freq: Once | INTRAMUSCULAR | Status: AC
  Administered 2016-01-06: 15 mg via INTRAVENOUS
  Filled 2016-01-06: qty 1

## 2016-01-06 MED ORDER — HYDROMORPHONE HCL 1 MG/ML IJ SOLN
0.5000 mg | Freq: Once | INTRAMUSCULAR | Status: AC
  Administered 2016-01-06: 0.5 mg via INTRAVENOUS
  Filled 2016-01-06: qty 1

## 2016-01-06 MED ORDER — ONDANSETRON HCL 4 MG/2ML IJ SOLN
4.0000 mg | Freq: Once | INTRAMUSCULAR | Status: AC
  Administered 2016-01-06: 4 mg via INTRAVENOUS
  Filled 2016-01-06: qty 2

## 2016-01-06 MED ORDER — HYDROMORPHONE HCL 1 MG/ML IJ SOLN
1.0000 mg | Freq: Once | INTRAMUSCULAR | Status: AC
  Administered 2016-01-06: 1 mg via INTRAVENOUS
  Filled 2016-01-06: qty 1

## 2016-01-06 MED ORDER — OXYCODONE-ACETAMINOPHEN 5-325 MG PO TABS: 1.0000 | ORAL_TABLET | ORAL | Status: DC | PRN

## 2016-01-06 NOTE — ED Notes (Signed)
Pt instructed to strain all urine at home and given a strainer.

## 2016-02-27 ENCOUNTER — Ambulatory Visit: Payer: Self-pay | Admitting: Orthopedic Surgery

## 2016-03-06 ENCOUNTER — Ambulatory Visit (INDEPENDENT_AMBULATORY_CARE_PROVIDER_SITE_OTHER): Payer: PRIVATE HEALTH INSURANCE | Admitting: Orthopedic Surgery

## 2016-03-06 ENCOUNTER — Encounter: Payer: Self-pay | Admitting: Orthopedic Surgery

## 2016-03-06 VITALS — BP 139/88 | HR 101 | Ht 63.0 in | Wt 134.0 lb

## 2016-03-06 DIAGNOSIS — M5442 Lumbago with sciatica, left side: Secondary | ICD-10-CM | POA: Diagnosis not present

## 2016-03-06 NOTE — Patient Instructions (Addendum)
BACK EXERCISES DAILY CONTINUE GABAPENTIN AS NEEDED FOLLOW UP AS NEEDED    Back Exercises If you have pain in your back, do these exercises 2-3 times each day or as told by your doctor. When the pain goes away, do the exercises once each day, but repeat the steps more times for each exercise (do more repetitions). If you do not have pain in your back, do these exercises once each day or as told by your doctor. EXERCISES Single Knee to Chest Do these steps 3-5 times in a row for each leg: 1. Lie on your back on a firm bed or the floor with your legs stretched out. 2. Bring one knee to your chest. 3. Hold your knee to your chest by grabbing your knee or thigh. 4. Pull on your knee until you feel a gentle stretch in your lower back. 5. Keep doing the stretch for 10-30 seconds. 6. Slowly let go of your leg and straighten it. Pelvic Tilt Do these steps 5-10 times in a row: 1. Lie on your back on a firm bed or the floor with your legs stretched out. 2. Bend your knees so they point up to the ceiling. Your feet should be flat on the floor. 3. Tighten your lower belly (abdomen) muscles to press your lower back against the floor. This will make your tailbone point up to the ceiling instead of pointing down to your feet or the floor. 4. Stay in this position for 5-10 seconds while you gently tighten your muscles and breathe evenly. Cat-Cow Do these steps until your lower back bends more easily: 1. Get on your hands and knees on a firm surface. Keep your hands under your shoulders, and keep your knees under your hips. You may put padding under your knees. 2. Let your head hang down, and make your tailbone point down to the floor so your lower back is round like the back of a cat. 3. Stay in this position for 5 seconds. 4. Slowly lift your head and make your tailbone point up to the ceiling so your back hangs low (sags) like the back of a cow. 5. Stay in this position for 5 seconds. Press-Ups Do  these steps 5-10 times in a row: 1. Lie on your belly (face-down) on the floor. 2. Place your hands near your head, about shoulder-width apart. 3. While you keep your back relaxed and keep your hips on the floor, slowly straighten your arms to raise the top half of your body and lift your shoulders. Do not use your back muscles. To make yourself more comfortable, you may change where you place your hands. 4. Stay in this position for 5 seconds. 5. Slowly return to lying flat on the floor. Bridges Do these steps 10 times in a row: 1. Lie on your back on a firm surface. 2. Bend your knees so they point up to the ceiling. Your feet should be flat on the floor. 3. Tighten your butt muscles and lift your butt off of the floor until your waist is almost as high as your knees. If you do not feel the muscles working in your butt and the back of your thighs, slide your feet 1-2 inches farther away from your butt. 4. Stay in this position for 3-5 seconds. 5. Slowly lower your butt to the floor, and let your butt muscles relax. If this exercise is too easy, try doing it with your arms crossed over your chest. Belly Crunches Do these steps  5-10 times in a row: 1. Lie on your back on a firm bed or the floor with your legs stretched out. 2. Bend your knees so they point up to the ceiling. Your feet should be flat on the floor. 3. Cross your arms over your chest. 4. Tip your chin a little bit toward your chest but do not bend your neck. 5. Tighten your belly muscles and slowly raise your chest just enough to lift your shoulder blades a tiny bit off of the floor. 6. Slowly lower your chest and your head to the floor. Back Lifts Do these steps 5-10 times in a row: 1. Lie on your belly (face-down) with your arms at your sides, and rest your forehead on the floor. 2. Tighten the muscles in your legs and your butt. 3. Slowly lift your chest off of the floor while you keep your hips on the floor. Keep the back  of your head in line with the curve in your back. Look at the floor while you do this. 4. Stay in this position for 3-5 seconds. 5. Slowly lower your chest and your face to the floor. GET HELP IF:  Your back pain gets a lot worse when you do an exercise.  Your back pain does not lessen 2 hours after you exercise. If you have any of these problems, stop doing the exercises. Do not do them again unless your doctor says it is okay. GET HELP RIGHT AWAY IF:  You have sudden, very bad back pain. If this happens, stop doing the exercises. Do not do them again unless your doctor says it is okay.   This information is not intended to replace advice given to you by your health care provider. Make sure you discuss any questions you have with your health care provider.   Document Released: 09/29/2010 Document Revised: 05/18/2015 Document Reviewed: 10/21/2014 Elsevier Interactive Patient Education [employer].

## 2016-03-06 NOTE — Progress Notes (Signed)
Patient ID: Erica Cox, female   DOB: August 01, 1971, 45 y.o.   MRN: [id number]  Chief Complaint  Patient presents with  . Follow-up    Recheck back     HPI - History of intermittent 10 on diagnosed lower back pain with radicular pain in the left leg. Treated with gabapentin on an as-needed basis with fairly good success. Patient underwent physical therapy but no longer does home exercises.  ROS  Bowel bladder function intact  BP 139/88 mmHg  Pulse 101  Ht 5' 3"  (1.6 m)  Wt 134 lb (60.782 kg)  BMI 23.74 kg/m2 Gen. appearance is normal grooming and hygiene Orientation to person place and time normal Mood normal Gait is normal  No peripheral edema or swelling is noted in the right or left leg Sensory exam shows normal sensation to palpation, pressure and soft touch Skin exam no lacerations ulcerations or erythema  Ortho Exam  Tenderness lumbar spine left SI joint straight leg raise negative in the seated position.  No weakness in the lower extremities A/P  Medical decision-making  No diagnosis found. Diagnosis unexplained radicular pain left leg no better or no worse  Recommend home exercise program gabapentin as needed follow-up as needed 2011 mri Clinical Data: Low back pain extending to the left buttock and leg.    MRI LUMBAR SPINE WITHOUT CONTRAST    Technique:  Multiplanar and multiecho pulse sequences of the lumbar spine were obtained without intravenous contrast.    Comparison: 04/25/2004    Findings: Alignment is normal.  The intervertebral discs are normal in signal characteristics and morphology.  The spinal canal and foramina are widely patent.  The distal cord and conus are normal. No osseous or articular abnormalities seen.  Paravertebral soft tissues appear normal.    IMPRESSION: Normal MRI of the lumbar spine.   Provider: Darletta Moll, MD 03/06/2016 11:38 AM

## 2016-07-10 ENCOUNTER — Other Ambulatory Visit: Payer: Self-pay | Admitting: Adult Health

## 2016-08-29 ENCOUNTER — Ambulatory Visit
Admission: RE | Admit: 2016-08-29 | Discharge: 2016-08-29 | Disposition: A | Discharge status: Home / Self Care | Payer: PRIVATE HEALTH INSURANCE | Source: Ambulatory Visit | Attending: Chiropractic Medicine | Admitting: Chiropractic Medicine

## 2016-08-29 ENCOUNTER — Other Ambulatory Visit: Payer: Self-pay | Admitting: Chiropractic Medicine

## 2016-08-29 DIAGNOSIS — G43809 Other migraine, not intractable, without status migrainosus: Secondary | ICD-10-CM

## 2016-09-26 ENCOUNTER — Other Ambulatory Visit: Payer: Self-pay | Admitting: Adult Health

## 2016-10-10 ENCOUNTER — Ambulatory Visit (INDEPENDENT_AMBULATORY_CARE_PROVIDER_SITE_OTHER): Payer: PRIVATE HEALTH INSURANCE | Admitting: Adult Health

## 2016-10-10 ENCOUNTER — Encounter: Payer: Self-pay | Admitting: Adult Health

## 2016-10-10 VITALS — BP 140/90 | HR 74 | Ht 64.0 in | Wt 139.0 lb

## 2016-10-10 DIAGNOSIS — R809 Proteinuria, unspecified: Secondary | ICD-10-CM

## 2016-10-10 DIAGNOSIS — R3915 Urgency of urination: Secondary | ICD-10-CM | POA: Diagnosis not present

## 2016-10-10 DIAGNOSIS — R350 Frequency of micturition: Secondary | ICD-10-CM | POA: Diagnosis not present

## 2016-10-10 DIAGNOSIS — Z638 Other specified problems related to primary support group: Secondary | ICD-10-CM

## 2016-10-10 LAB — POCT URINALYSIS DIPSTICK
Blood, UA: NEGATIVE
GLUCOSE UA: NEGATIVE
Leukocytes, UA: NEGATIVE
Nitrite, UA: NEGATIVE

## 2016-10-10 MED ORDER — URIBEL 118 MG PO CAPS: ORAL_CAPSULE | ORAL | 0 refills | Status: DC

## 2016-10-10 NOTE — Progress Notes (Signed)
Subjective:     Patient ID: Erica Cox, female   DOB: 04-06-1971, 46 y.o.   MRN: [id number]  HPI Amadi Yoshino is a 46 year old white female in complaining of urinary urgency and frequency, esp after sex, has noticed for about 2 months(they have tried different positions, without relief).She is seeing a Restaurant manager, fast food in [place] for back misalignment. She is still working from home and cares for her Dad.   Review of Systems +urinary frequency and urgency  Reviewed past medical,surgical, social and family history. Reviewed medications and allergies.     Objective:   Physical Exam BP 140/90 (BP Location: Left Arm, Patient Position: Sitting, Cuff Size: Normal)   Pulse 74   Ht 5' 4"  (1.626 m)   Wt 139 lb (63 kg)   BMI 23.86 kg/m urine trace protein,   Skin warm and dry.Pelvic: external genitalia is normal in appearance no lesions, vagina: scant discharge without odor,urethra has no lesions or masses noted, cervix:everted with multiple nabothian cysts, uterus: normal size, shape and contour, non tender, no masses felt, adnexa: no masses or tenderness noted. Bladder is  tender and no masses felt.  PHQ 9 score 13, is on meds and see Pablo Lawrence NP. I have discussed with her could be IC, will do trial of uribel to see if helps, and decrease caffeine, spicy foods and alcohol. Face time 15 minutes with 50 % counseling, as above.  Assessment:     1. Urinary urgency   2. Urinary frequency   3. Proteinuria, unspecified type   4.      Stress due to family tension     Plan:    UA C&S sent  Rx uribel #90 take 1 tid, no refills Take time for self Decrease caffeine Increase water Follow up in 2 weeks

## 2016-10-10 NOTE — Patient Instructions (Signed)
Decrease caffeine Increase water Take uribel tid Follow up in 2 weeks

## 2016-10-11 LAB — URINALYSIS, ROUTINE W REFLEX MICROSCOPIC
Bilirubin, UA: NEGATIVE
Glucose, UA: NEGATIVE
Nitrite, UA: NEGATIVE
PH UA: 5 (ref 5.0–7.5)
Protein, UA: NEGATIVE
RBC UA: NEGATIVE
Specific Gravity, UA: >=1.03 — AB (ref 1.005–1.030)
Urobilinogen, Ur: 0.2 mg/dL (ref 0.2–1.0)

## 2016-10-11 LAB — MICROSCOPIC EXAMINATION: CASTS: NONE SEEN /LPF

## 2016-10-12 LAB — URINE CULTURE: ORGANISM ID, BACTERIA: NO GROWTH

## 2016-10-24 ENCOUNTER — Ambulatory Visit: Payer: PRIVATE HEALTH INSURANCE | Admitting: Adult Health

## 2016-10-30 ENCOUNTER — Encounter: Payer: Self-pay | Admitting: Adult Health

## 2016-10-30 ENCOUNTER — Ambulatory Visit (INDEPENDENT_AMBULATORY_CARE_PROVIDER_SITE_OTHER): Payer: PRIVATE HEALTH INSURANCE | Admitting: Adult Health

## 2016-10-30 VITALS — BP 130/80 | HR 86 | Ht 64.0 in | Wt 137.5 lb

## 2016-10-30 DIAGNOSIS — N3010 Interstitial cystitis (chronic) without hematuria: Secondary | ICD-10-CM

## 2016-10-30 NOTE — Progress Notes (Signed)
Subjective:     Patient ID: Erica Cox, female   DOB: [birth year], 46 y.o.   MRN: [id number]  HPI Erica Cox is a 46 year old white female back in follow up of trying uribel for urinary frequency and urgency and she is better, but has not had sex yet. Urine culture on 10/10/16 showed no growth.   Review of Systems  Has had decrease in frequency and urgency or urination.  Reviewed past medical,surgical, social and family history. Reviewed medications and allergies.     Objective:   Physical Exam BP 130/80 (BP Location: Left Arm, Patient Position: Sitting, Cuff Size: Normal)   Pulse 86   Ht 5' 4"  (1.626 m)   Wt 137 lb 8 oz (62.4 kg)   BMI 23.60 kg/m   PHQ 2 score 0.Talk only, she as on uribel and it helped with frequency and urgency of urination, but she has not had sex yet.She has been off uribel for about a week now.    Assessment:     IC    Plan:       Resume uribel Take sex this weekend and call me next week with up date May get scheduled with Dr Elonda Husky for [facility] or [facility]

## 2016-12-24 ENCOUNTER — Telehealth: Payer: Self-pay | Admitting: *Deleted

## 2016-12-24 NOTE — Telephone Encounter (Signed)
She says bladder is bothering her, make appt to see Dr Elonda Husky

## 2016-12-28 ENCOUNTER — Ambulatory Visit (INDEPENDENT_AMBULATORY_CARE_PROVIDER_SITE_OTHER): Payer: PRIVATE HEALTH INSURANCE | Admitting: Obstetrics & Gynecology

## 2016-12-28 ENCOUNTER — Encounter: Payer: Self-pay | Admitting: Obstetrics & Gynecology

## 2016-12-28 VITALS — BP 134/86 | HR 89 | Wt 138.0 lb

## 2016-12-28 DIAGNOSIS — N3010 Interstitial cystitis (chronic) without hematuria: Secondary | ICD-10-CM

## 2016-12-28 DIAGNOSIS — Z1389 Encounter for screening for other disorder: Secondary | ICD-10-CM | POA: Diagnosis not present

## 2016-12-28 LAB — POCT URINALYSIS DIPSTICK
Blood, UA: NEGATIVE
Glucose, UA: NEGATIVE
KETONES UA: NEGATIVE
Nitrite, UA: NEGATIVE
PROTEIN UA: NEGATIVE

## 2016-12-28 MED ORDER — SOLIFENACIN SUCCINATE 10 MG PO TABS: 10.0000 mg | ORAL_TABLET | Freq: Every day | ORAL | 11 refills | Status: DC

## 2016-12-28 MED ORDER — PENTOSAN POLYSULFATE SODIUM 100 MG PO CAPS: ORAL_CAPSULE | ORAL | 11 refills | Status: DC

## 2016-12-28 NOTE — Progress Notes (Signed)
Chief Complaint  Patient presents with  . Follow-up    IC    Blood pressure 134/86, pulse 89, weight 138 lb (62.6 kg).  46 y.o. G1P0010 No LMP recorded (lmp unknown). Patient is not currently having periods (Reason: Oral contraceptives). The current method of family planning is OCP (estrogen/progesterone).  Outpatient Encounter Prescriptions as of 12/28/2016  Medication Sig  . Cetirizine HCl (ZYRTEC ALLERGY PO) Take by mouth as needed.  . diclofenac (VOLTAREN) 75 MG EC tablet Take 75 mg by mouth as needed.  . Levomilnacipran HCl ER (FETZIMA) 80 MG CP24 Take 1 tablet by mouth daily.  . LO LOESTRIN FE 1 MG-10 MCG / 10 MCG tablet TAKE 1 TABLET BY MOUTH DAILY.  Marland Kitchen omeprazole (PRILOSEC) 20 MG capsule Take 20 mg by mouth daily.  . rizatriptan (MAXALT) 10 MG tablet Take 10 mg by mouth as needed for migraine. May repeat in 2 hours if needed  . SUMAtriptan (IMITREX) 100 MG tablet Take 100 mg by mouth every 2 (two) hours as needed for migraine or headache. May repeat in 2 hours if headache persists or recurs.  . topiramate (TOPAMAX) 100 MG tablet Take 200-300 mg by mouth daily. PM  . ALPRAZolam (XANAX) 1 MG tablet Take 1 mg by mouth at bedtime as needed for anxiety.  . pentosan polysulfate (ELMIRON) 100 MG capsule 2 tablets twice a day  . solifenacin (VESICARE) 10 MG tablet Take 1 tablet (10 mg total) by mouth daily. bedtime   No facility-administered encounter medications on file as of 12/28/2016.     Subjective Pt with classic symptoms of IC with negative urine cultures Pt has been informed regarding triggers Responds beautifully to uribel  Objective   Pertinent ROS No burning with urination, frequency or urgency No nausea, vomiting or diarrhea Nor fever chills or other constitutional symptoms   Labs or studies reviewed    Impression Diagnoses this Encounter::   ICD-9-CM ICD-10-CM   1. IC (interstitial cystitis) 595.1 N30.10   2. Screening for genitourinary condition  V81.6 Z13.89 POCT urinalysis dipstick    Established relevant diagnosis(es): migraines  Plan/Recommendations: Meds ordered this encounter  Medications  . solifenacin (VESICARE) 10 MG tablet    Sig: Take 1 tablet (10 mg total) by mouth daily. bedtime    Dispense:  30 tablet    Refill:  11  . pentosan polysulfate (ELMIRON) 100 MG capsule    Sig: 2 tablets twice a day    Dispense:  120 capsule    Refill:  11    Labs or Scans Ordered: Orders Placed This Encounter  Procedures  . POCT urinalysis dipstick    Management:: Start elmiron vesicare Start Dmso in 2 weeks  Follow up Return in about 2 weeks (around 01/11/2017) for DMSO.        Face to face time:  15 minutes  Greater than 50% of the visit time was spent in counseling and coordination of care with the patient.  The summary and outline of the counseling and care coordination is summarized in the note above.   All questions were answered.  Past Medical History:  Diagnosis Date  . Anxiety   . Breast nodule 12/14/2013   Will get diagnostic mammogram and right [place] if needed  . Contraceptive management 11/11/2015  . Cough 12/28/2015  . Depression   . Eversion of cervix 12/28/2015  . Headache(784.0)    migraines  . Hypertension 11/11/2015  . Macromastia   . Pollen allergies 12/28/2015  .  Postcoital bleeding 12/28/2015  . Sciatic pain   . SUI (stress urinary incontinence, female) 12/14/2013  . Vaginal Pap smear, abnormal     Past Surgical History:  Procedure Laterality Date  . BREAST BIOPSY Right 02/05/2014   Procedure: BREAST BIOPSY;  Surgeon: [name], MD;  Location: [facility];  Service: General;  Laterality: Right;  . BREAST REDUCTION SURGERY  08/28/2011   Procedure: MAMMARY REDUCTION BILATERAL (BREAST);  Surgeon: Mary A Contogiannis;  Location: [facility];  Service: Plastics;  Laterality: Bilateral;  . CHOLECYSTECTOMY    . colpo  11/22/2015   with biopsy  . ORIF DISTAL RADIUS FRACTURE  04/28/2010     left    OB History    Gravida Para Term Preterm AB Living   1       1     SAB TAB Ectopic Multiple Live Births                  Allergies  Allergen Reactions  . Adhesive [Tape] Hives    Social History   Social History  . Marital status: Divorced    Spouse name: N/A  . Number of children: N/A  . Years of education: N/A   Social History Main Topics  . Smoking status: Never Smoker  . Smokeless tobacco: Never Used  . Alcohol use Yes     Comment: occ  . Drug use: No  . Sexual activity: Yes    Birth control/ protection: Pill   Other Topics Concern  . None   Social History Narrative  . None    Family History  Problem Relation Age of Onset  . Diabetes Father   . Hypertension Father   . Other Father     heart issues  . Hypertension Mother   . Vision loss Brother   . Cancer Paternal Grandmother     breast  . Emphysema Paternal Grandmother   . Asthma Paternal Grandmother   . COPD Paternal Grandmother   . Heart disease Paternal Grandfather   . Heart attack Paternal Grandfather

## 2017-01-11 ENCOUNTER — Ambulatory Visit (INDEPENDENT_AMBULATORY_CARE_PROVIDER_SITE_OTHER): Payer: PRIVATE HEALTH INSURANCE | Admitting: Obstetrics & Gynecology

## 2017-01-11 ENCOUNTER — Encounter: Payer: Self-pay | Admitting: Obstetrics & Gynecology

## 2017-01-11 VITALS — BP 138/86 | HR 84 | Ht 64.0 in | Wt 136.0 lb

## 2017-01-11 DIAGNOSIS — N3010 Interstitial cystitis (chronic) without hematuria: Secondary | ICD-10-CM | POA: Diagnosis not present

## 2017-01-11 NOTE — Progress Notes (Signed)
Diagnosed with IC: 4/12018 Current Meds:  Elmiron, vesicare, 1st installation of DMSO today Dietary restrictions    Pt states her symptoms have been stable, no exacerbations, although not perfect Wants to continue on this cycle  Blood pressure 138/86, pulse 84, height 5' 4"  (1.626 m), weight 136 lb (61.7 kg).    The external urethra meatus was prepped with betadine DMSO 50 cc was instilled in the usual fashion after the bladder was catheterized and emptied completely 50cc was instilled into the bladder without difficulty and the patient tolerated well She will refrain from voiding as long as possible  Follow up in 3 weeks, or as patient requests based on her symptom complex

## 2017-02-01 ENCOUNTER — Ambulatory Visit: Payer: PRIVATE HEALTH INSURANCE | Admitting: Obstetrics & Gynecology

## 2017-02-12 ENCOUNTER — Encounter: Payer: Self-pay | Admitting: Obstetrics & Gynecology

## 2017-02-12 ENCOUNTER — Ambulatory Visit (INDEPENDENT_AMBULATORY_CARE_PROVIDER_SITE_OTHER): Payer: PRIVATE HEALTH INSURANCE | Admitting: Obstetrics & Gynecology

## 2017-02-12 VITALS — BP 142/86 | HR 80 | Ht 64.0 in | Wt 143.0 lb

## 2017-02-12 DIAGNOSIS — N3010 Interstitial cystitis (chronic) without hematuria: Secondary | ICD-10-CM

## 2017-02-12 NOTE — Progress Notes (Signed)
Diagnosed with IC: 4/12018 Current Meds:  Elmiron, vesicare, 2nd installation of DMSO today Dietary restrictions    Pt states her symptoms have been improving, no exacerbations, although not perfect Wants to continue on this cycle  Blood pressure (!) 142/86, pulse 80, height 5' 4"  (1.626 m), weight 143 lb (64.9 kg).    The external urethra meatus was prepped with betadine DMSO 50 cc was instilled in the usual fashion after the bladder was catheterized and emptied completely 50cc was instilled into the bladder without difficulty and the patient tolerated well She will refrain from voiding as long as possible  Follow up in 4 weeks, or as patient requests based on her symptom complex  Past Medical History:  Diagnosis Date  . Anxiety   . Breast nodule 12/14/2013   Will get diagnostic mammogram and right [place] if needed  . Contraceptive management 11/11/2015  . Cough 12/28/2015  . Depression   . Eversion of cervix 12/28/2015  . Headache(784.0)    migraines  . Hypertension 11/11/2015  . Macromastia   . Pollen allergies 12/28/2015  . Postcoital bleeding 12/28/2015  . Sciatic pain   . SUI (stress urinary incontinence, female) 12/14/2013  . Vaginal Pap smear, abnormal     Past Surgical History:  Procedure Laterality Date  . BREAST BIOPSY Right 02/05/2014   Procedure: BREAST BIOPSY;  Surgeon: [name], MD;  Location: [facility];  Service: General;  Laterality: Right;  . BREAST REDUCTION SURGERY  08/28/2011   Procedure: MAMMARY REDUCTION BILATERAL (BREAST);  Surgeon: Mary A Contogiannis;  Location: [facility];  Service: Plastics;  Laterality: Bilateral;  . CHOLECYSTECTOMY    . colpo  11/22/2015   with biopsy  . ORIF DISTAL RADIUS FRACTURE  04/28/2010   left    OB History    Gravida Para Term Preterm AB Living   1       1     SAB TAB Ectopic Multiple Live Births                  Allergies  Allergen Reactions  . Adhesive [Tape] Hives    Social History   Social  History  . Marital status: Divorced    Spouse name: N/A  . Number of children: N/A  . Years of education: N/A   Social History Main Topics  . Smoking status: Never Smoker  . Smokeless tobacco: Never Used  . Alcohol use Yes     Comment: occ  . Drug use: No  . Sexual activity: Yes    Birth control/ protection: Pill   Other Topics Concern  . None   Social History Narrative  . None    Family History  Problem Relation Age of Onset  . Diabetes Father   . Hypertension Father   . Other Father        heart issues  . Hypertension Mother   . Vision loss Brother   . Cancer Paternal Grandmother        breast  . Emphysema Paternal Grandmother   . Asthma Paternal Grandmother   . COPD Paternal Grandmother   . Heart disease Paternal Grandfather   . Heart attack Paternal Grandfather

## 2017-03-15 ENCOUNTER — Ambulatory Visit: Payer: PRIVATE HEALTH INSURANCE | Admitting: Obstetrics & Gynecology

## 2017-03-21 ENCOUNTER — Ambulatory Visit (INDEPENDENT_AMBULATORY_CARE_PROVIDER_SITE_OTHER): Payer: PRIVATE HEALTH INSURANCE | Admitting: Obstetrics & Gynecology

## 2017-03-21 ENCOUNTER — Encounter: Payer: Self-pay | Admitting: Obstetrics & Gynecology

## 2017-03-21 VITALS — BP 122/84 | HR 96 | Wt 143.0 lb

## 2017-03-21 DIAGNOSIS — N3010 Interstitial cystitis (chronic) without hematuria: Secondary | ICD-10-CM | POA: Diagnosis not present

## 2017-03-21 MED ORDER — URIBEL 118 MG PO CAPS: ORAL_CAPSULE | ORAL | 11 refills | Status: DC

## 2017-03-21 NOTE — Progress Notes (Signed)
Diagnosed with IC: April 2018  Current Meds:  Elmiron, uribel if needed, episodic DMSO irrigations Dietary restrictions    Pt states her symptoms have been stable, no exacerbations, although not perfect Wants to continue on this cycle She is not even having to take the uribel at this point and doing well  Blood pressure 122/84, pulse 96, weight 143 lb (64.9 kg).    The external urethra meatus was prepped with betadine DMSO 50 cc was instilled in the usual fashion after the bladder was catheterized and emptied completely 50cc was instilled into the bladder without difficulty and the patient tolerated well She will refrain from voiding as long as possible  Follow up in 5 weeks, or as patient requests based on her symptom complex

## 2017-04-25 ENCOUNTER — Ambulatory Visit: Payer: PRIVATE HEALTH INSURANCE | Admitting: Obstetrics & Gynecology

## 2017-05-07 ENCOUNTER — Ambulatory Visit (INDEPENDENT_AMBULATORY_CARE_PROVIDER_SITE_OTHER): Payer: PRIVATE HEALTH INSURANCE | Admitting: Obstetrics & Gynecology

## 2017-05-07 ENCOUNTER — Encounter: Payer: Self-pay | Admitting: Obstetrics & Gynecology

## 2017-05-07 VITALS — BP 132/80 | HR 95 | Ht 64.0 in | Wt 146.0 lb

## 2017-05-07 DIAGNOSIS — N3010 Interstitial cystitis (chronic) without hematuria: Secondary | ICD-10-CM | POA: Diagnosis not present

## 2017-05-07 NOTE — Progress Notes (Signed)
Chief Complaint  Patient presents with  . Bladder Irrigation   Blood pressure 132/80, pulse 95, height 5' 4"  (1.626 m), weight 146 lb (66.2 kg).  And was diagnosed with interstitial cystitis in April 2018 and is currently [employer] on your bale as needed and episodic DMSO irrigations as well as dietary restrictions  Her symptoms have been stable getting a little worse of course a Lily Peer she gets away from her last irrigation both no dramatic exacerbations She she takes your bale occasionally on average about one a day  The external urethral meatus was prepped with Betadine and 50 cc of DMSO was instilled in the usual fashion after the bladder had been emptied completely  Patient tolerated this well and nose to leave the DMSO and as long as possible so she will refrain from voiding Her last instillation she was able to hold it in for 30 minutes which is an improvement over initial installations  Follow-up in 6 weeks orders at patient request symptom complex

## 2017-06-18 ENCOUNTER — Ambulatory Visit (INDEPENDENT_AMBULATORY_CARE_PROVIDER_SITE_OTHER): Payer: PRIVATE HEALTH INSURANCE | Admitting: Obstetrics & Gynecology

## 2017-06-18 VITALS — BP 140/80 | HR 100 | Ht 64.0 in | Wt 148.0 lb

## 2017-06-18 DIAGNOSIS — N3010 Interstitial cystitis (chronic) without hematuria: Secondary | ICD-10-CM | POA: Diagnosis not present

## 2017-06-18 NOTE — Progress Notes (Signed)
Patient ID: Erica Cox, female   DOB: [birth year], 46 y.o.   MRN: [id number]    Chief Complaint  Patient presents with  . Bladder Irrigation    Blood pressure 140/80, pulse 100, height 5' 4"  (1.626 m), weight 148 lb (67.1 kg).  Loveah Like Cox is here today for her routine DMSO bladder irrigation  She was diagnosed with interstitial cystitis in April 2018  She is currently taking Elmiron twice daily and uribel as needed, which basically bowls down to 1 in the morning and 1 at night I have asked and to wean off of the ureter Bell and see how her bladder does then  Today she is without complaints essentially she has 1 little area says it occasionally bothers her but otherwise is doing well  The external urethral meatus is prepped with Betadine A 7 French catheter is inserted and about 10 cc of urine is drained 50 cc of DMSO is instilled in the bladder in the usual fashion without difficulty  Patient tolerated the procedure well She understands she needs to hold it in as long as possible for maximal effect  I have instructed her delivering 2 MRI tablets to her next visit so we can put this in the DMSO irrigant  We'll see her back this time in 8 weeks unless she has problems in the meantime she will contact us

## 2017-08-13 ENCOUNTER — Ambulatory Visit: Payer: PRIVATE HEALTH INSURANCE | Admitting: Obstetrics & Gynecology

## 2017-09-24 ENCOUNTER — Other Ambulatory Visit: Payer: Self-pay

## 2017-09-24 ENCOUNTER — Ambulatory Visit (INDEPENDENT_AMBULATORY_CARE_PROVIDER_SITE_OTHER): Payer: PRIVATE HEALTH INSURANCE | Admitting: Obstetrics & Gynecology

## 2017-09-24 ENCOUNTER — Encounter: Payer: Self-pay | Admitting: Obstetrics & Gynecology

## 2017-09-24 VITALS — BP 124/86 | HR 97 | Ht 63.0 in | Wt 148.0 lb

## 2017-09-24 DIAGNOSIS — N3010 Interstitial cystitis (chronic) without hematuria: Secondary | ICD-10-CM

## 2017-09-24 NOTE — Progress Notes (Signed)
  Patient ID: Erica Cox Page, female   DOB: 1971/02/10, 47 y.o.   MRN: [id number]  Diagnosed with IC: 2018  Current Meds:  elmiron DMSO Dietary restrictions    Pt states her symptoms have been stable, no exacerbations, although not perfect Wants to continue on this cycle  Blood pressure 124/86, pulse 97, height 5' 3"  (1.6 m), weight 148 lb (67.1 kg).    The external urethra meatus was prepped with betadine DMSO 50 cc was instilled in the usual fashion after the bladder was catheterized and emptied completely 50cc was instilled into the bladder without difficulty and the patient tolerated well She will refrain from voiding as long as possible  Follow up in 8 weeks, or as patient requests based on her symptom complex

## 2017-10-07 ENCOUNTER — Other Ambulatory Visit: Payer: Self-pay | Admitting: Adult Health

## 2017-11-19 ENCOUNTER — Ambulatory Visit (INDEPENDENT_AMBULATORY_CARE_PROVIDER_SITE_OTHER): Payer: PRIVATE HEALTH INSURANCE | Admitting: Obstetrics & Gynecology

## 2017-11-19 ENCOUNTER — Encounter: Payer: Self-pay | Admitting: Obstetrics & Gynecology

## 2017-11-19 ENCOUNTER — Other Ambulatory Visit: Payer: Self-pay

## 2017-11-19 VITALS — BP 128/82 | HR 105 | Ht 64.0 in | Wt 152.0 lb

## 2017-11-19 DIAGNOSIS — N3010 Interstitial cystitis (chronic) without hematuria: Secondary | ICD-10-CM

## 2017-11-19 NOTE — Progress Notes (Signed)
Diagnosed with IC: 12/2016  Current Meds:  Elmiron, DMSO Dietary restrictions    Pt states her symptoms have been stable, no exacerbations, although not perfect Wants to continue on this cycle  Blood pressure 128/82, pulse (!) 105, height 5' 4"  (1.626 m), weight 152 lb (68.9 kg).    The external urethra meatus was prepped with betadine DMSO 50 cc was instilled in the usual fashion after the bladder was catheterized and emptied completely 50cc was instilled into the bladder without difficulty and the patient tolerated well She will refrain from voiding as long as possible  Follow up in 8 weeks, or as patient requests based on her symptom complex

## 2018-01-14 ENCOUNTER — Encounter: Payer: Self-pay | Admitting: Obstetrics & Gynecology

## 2018-01-14 ENCOUNTER — Ambulatory Visit (INDEPENDENT_AMBULATORY_CARE_PROVIDER_SITE_OTHER): Payer: PRIVATE HEALTH INSURANCE | Admitting: Obstetrics & Gynecology

## 2018-01-14 VITALS — BP 140/100 | HR 98 | Ht 64.0 in | Wt 152.6 lb

## 2018-01-14 DIAGNOSIS — N3010 Interstitial cystitis (chronic) without hematuria: Secondary | ICD-10-CM

## 2018-01-14 MED ORDER — NYSTATIN-TRIAMCINOLONE 100000-0.1 UNIT/GM-% EX OINT: 1.0000 "application " | TOPICAL_OINTMENT | Freq: Two times a day (BID) | CUTANEOUS | 11 refills | Status: DC

## 2018-01-14 NOTE — Progress Notes (Signed)
Diagnosed with IC: 2 years ago  Current Meds:  Elmiron, DMSO Dietary restrictions    Pt states her symptoms have been stable, no exacerbations, although not perfect Wants to continue on this cycle  Blood pressure (!) 140/100, pulse 98, height 5' 4"  (1.626 m), weight 152 lb 9.6 oz (69.2 kg).   BP is good at home, she checks it and it is ok  The external urethra meatus was prepped with betadine DMSO 50 cc was instilled in the usual fashion after the bladder was catheterized and emptied completely 50cc was instilled into the bladder without difficulty and the patient tolerated well She will refrain from voiding as long as possible  Follow up in 8 weeks, or as patient requests based on her symptom complex

## 2018-01-29 ENCOUNTER — Telehealth: Payer: Self-pay | Admitting: Obstetrics & Gynecology

## 2018-01-29 MED ORDER — PENTOSAN POLYSULFATE SODIUM 100 MG PO CAPS: ORAL_CAPSULE | ORAL | 11 refills | Status: DC

## 2018-01-29 NOTE — Telephone Encounter (Signed)
Patient called stating that her pharmacy has been trying to get a refill request sent to our office for her [place]. Please contact pt

## 2018-01-29 NOTE — Telephone Encounter (Signed)
As far as I know I have not received any reqquest but I took care of it now

## 2018-01-30 NOTE — Telephone Encounter (Signed)
Informed patient Elmiron was sent to pharmacy.

## 2018-03-11 ENCOUNTER — Ambulatory Visit: Payer: PRIVATE HEALTH INSURANCE | Admitting: Obstetrics & Gynecology

## 2018-03-25 ENCOUNTER — Encounter: Payer: Self-pay | Admitting: Obstetrics & Gynecology

## 2018-03-25 ENCOUNTER — Ambulatory Visit (INDEPENDENT_AMBULATORY_CARE_PROVIDER_SITE_OTHER): Payer: PRIVATE HEALTH INSURANCE | Admitting: Obstetrics & Gynecology

## 2018-03-25 VITALS — BP 168/114 | HR 116 | Ht 64.0 in | Wt 154.0 lb

## 2018-03-25 DIAGNOSIS — N3010 Interstitial cystitis (chronic) without hematuria: Secondary | ICD-10-CM | POA: Diagnosis not present

## 2018-03-25 NOTE — Progress Notes (Signed)
Diagnosed with IC: 2018  Current Meds:  Elmiron DMSO Dietary restrictions    Pt states her symptoms have been stable, no exacerbations, although not perfect Wants to continue on this cycle  Blood pressure (!) 168/114, pulse (!) 116, height 5' 4"  (1.626 m), weight 154 lb (69.9 kg).   Pt states BP has been up lately, I instructed her to get home BP cuff and check 4 times daily and let either me or Pablo Lawrence, NP know what the values are   The external urethra meatus was prepped with betadine DMSO 50 cc was instilled in the usual fashion after the bladder was catheterized and emptied completely 50cc was instilled into the bladder without difficulty and the patient tolerated well She will refrain from voiding as long as possible  Follow up in 8 weeks, or as patient requests based on her symptom complex

## 2018-05-20 ENCOUNTER — Ambulatory Visit (INDEPENDENT_AMBULATORY_CARE_PROVIDER_SITE_OTHER): Payer: PRIVATE HEALTH INSURANCE | Admitting: Obstetrics & Gynecology

## 2018-05-20 ENCOUNTER — Other Ambulatory Visit: Payer: Self-pay

## 2018-05-20 ENCOUNTER — Encounter: Payer: Self-pay | Admitting: Obstetrics & Gynecology

## 2018-05-20 VITALS — BP 124/76 | HR 96 | Ht 64.0 in | Wt 161.0 lb

## 2018-05-20 DIAGNOSIS — N3010 Interstitial cystitis (chronic) without hematuria: Secondary | ICD-10-CM

## 2018-05-20 NOTE — Progress Notes (Signed)
Diagnosed with IC: 10/2016  Current Meds:  Elmiron, DMSO Dietary restrictions    Pt states her symptoms have been stable, no exacerbations, although not perfect Wants to continue on this cycle  Blood pressure 124/76, pulse 96, height 5' 4"  (1.626 m), weight 161 lb (73 kg).    The external urethra meatus was prepped with betadine DMSO 50 cc was instilled in the usual fashion after the bladder was catheterized and emptied completely 50cc was instilled into the bladder without difficulty and the patient tolerated well She will refrain from voiding as long as possible  Follow up in 8 weeks, or as patient requests based on her symptom complex

## 2018-07-15 ENCOUNTER — Ambulatory Visit (INDEPENDENT_AMBULATORY_CARE_PROVIDER_SITE_OTHER): Payer: PRIVATE HEALTH INSURANCE | Admitting: Obstetrics & Gynecology

## 2018-07-15 VITALS — BP 152/93 | HR 84 | Ht 64.0 in | Wt 159.0 lb

## 2018-07-15 DIAGNOSIS — N3010 Interstitial cystitis (chronic) without hematuria: Secondary | ICD-10-CM

## 2018-07-15 NOTE — Progress Notes (Signed)
Diagnosed with IC: 09/2016  Current Meds:  Elmiron Dietary restrictions    Pt states her symptoms have been stable, no exacerbations, although not perfect Wants to continue on this cycle  Blood pressure (!) 152/93, pulse 84, height 5' 4"  (1.626 m), weight 159 lb (72.1 kg).    The external urethra meatus was prepped with betadine DMSO 50 cc was instilled in the usual fashion after the bladder was catheterized and emptied completely 50cc was instilled into the bladder without difficulty and the patient tolerated well She will refrain from voiding as long as possible  Follow up in 12 weeks, or as patient requests based on her symptom complex

## 2018-10-10 ENCOUNTER — Ambulatory Visit: Payer: PRIVATE HEALTH INSURANCE | Admitting: Allergy & Immunology

## 2018-10-10 ENCOUNTER — Encounter: Payer: Self-pay | Admitting: Allergy & Immunology

## 2018-10-10 VITALS — BP 148/110 | HR 98 | Temp 98.3°F | Resp 16 | Ht 63.0 in | Wt 155.0 lb

## 2018-10-10 DIAGNOSIS — L235 Allergic contact dermatitis due to other chemical products: Secondary | ICD-10-CM

## 2018-10-10 NOTE — Patient Instructions (Signed)
1. Contact dermatitis - We are going to do patch testing. - These will have to be placed on Monday (in [place]) and then you can come on Wednesday and Friday for readings.  - This will help [place] to figure out which is triggering your symptoms.  2. No follow-ups on file.   Please inform us of any Emergency Department visits, hospitalizations, or changes in symptoms. Call us before going to the ED for breathing or allergy symptoms since we might be able to fit you in for a sick visit. Feel free to contact us anytime with any questions, problems, or concerns.  It was a pleasure to meet you today!  Websites that have reliable patient information: 1. American Academy of Asthma, Allergy, and Immunology: www.aaaai.org 2. Food Allergy Research and Education (FARE): foodallergy.org 3. Mothers of Asthmatics: http://www.asthmacommunitynetwork.org 4. American College of Allergy, Asthma, and Immunology: MonthlyElectricBill.co.uk   Make sure you are registered to vote! If you have moved or changed any of your contact information, you will need to get this updated before voting!    Voter ID laws are POSSIBLY going into effect for the General Election in November 2020! Be prepared! Check out http://levine.[email or web address] for more details.      True Test looks for the following sensitivities:

## 2018-10-10 NOTE — Progress Notes (Signed)
NEW PATIENT  Date of Service/Encounter:  01/31/20e  Referring provider: Celene Squibb, MD   Assessment:   Likely allergic dermatitis - needs patch testing  Plan/Recommendations:   1. Contact dermatitis - We are going to do patch testing. - These will have to be placed on Monday (in [place]) and then you can come on Wednesday and Friday for readings.  - This will help [place] to figure out which is triggering your symptoms.  2. Follow up on Monday for testing.  Subjective:   Erica Cox is a 48 y.o. female presenting today for evaluation of  Chief Complaint  Patient presents with  . Rash    Celene Squibb Falcon has a history of the following: Patient Active Problem List   Diagnosis Date Noted  . Postcoital bleeding 12/28/2015  . Cough 12/28/2015  . Pollen allergies 12/28/2015  . Eversion of cervix 12/28/2015  . Stress due to family tension 12/28/2015  . CIN I (cervical intraepithelial neoplasia I) 11/22/2015  . Hypertension 11/11/2015  . Contraceptive management 11/11/2015  . Breast nodule 12/14/2013  . SUI (stress urinary incontinence, female) 12/14/2013  . MONONEURITIS, LEG 04/17/2010  . THORACIC/LUMBOSACRAL NEURITIS/RADICULITIS UNSPEC 03/28/2010  . HIP PAIN, LEFT 03/15/2010  . HERNIATED LUMBOSACRAL DISC 03/15/2010    History obtained from: chart review and patient.  Celene Squibb Mascaro was referred by Celene Squibb, MD.     Erica Cox is a 48 y.o. female presenting for an evaluation of a rash. 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 It started back in June 2019 or so. She has been on permethrin, desonide, nystatin, triamcinolone, and Eucrisa. None of these has worked aside from systemic prednisone. She has been on prednisone for two separate rounds. She has been using Benadryl every 4 jours with improvement in the rash. It does not clear up completely with the Benadryl but it slowly improves. She did take bleach baths with improvement in her symptoms. [place]   She did see Dr. Nevada Crane (Dermatology) where he was diagnosed with scabies. She was treated with permethrin without improvement. She denies any blisters, but it does become raised. Ice does stop it from itching. She never had a biopsy performed. The dermatologist did not think that this was eczema and never mentioned anything about eczema treatment.   She has been making tumblers using an epoxy in her craft making. She has not used it since Christmas, but the rash is still present. 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 She did have problem with fake gold ear rings. She also notes that she reacted to a belly button ring. She has never had testing to look for chemical sensitivities.   She does have some mild hayfever symptoms in the spring, but they are typically not severe enough to warrant any kind of treatment. Otherwise, there is no history of other atopic diseases, including asthma, food allergies, drug allergies, environmental allergies, stinging insect allergies or urticaria. There is no significant infectious history. Vaccinations are up to date.    Past Medical History: Patient Active Problem List   Diagnosis Date Noted  . Postcoital bleeding 12/28/2015  . Cough 12/28/2015  . Pollen allergies 12/28/2015  . Eversion of cervix 12/28/2015  . Stress due to family tension 12/28/2015  . CIN I (cervical intraepithelial neoplasia I) 11/22/2015  . Hypertension 11/11/2015  . Contraceptive management 11/11/2015  . Breast nodule 12/14/2013  . SUI (  stress urinary incontinence, female) 12/14/2013  . MONONEURITIS, LEG 04/17/2010  . THORACIC/LUMBOSACRAL NEURITIS/RADICULITIS UNSPEC 03/28/2010  . HIP PAIN, LEFT 03/15/2010  . HERNIATED LUMBOSACRAL DISC 03/15/2010    Medication List:  Allergies as of 10/10/2018      Reactions   Adhesive [tape] Hives   Amoxicillin Nausea And Vomiting      Medication List       Accurate as of October 10, 2018 11:59 PM.  Always use your most recent med list.        AIMOVIG 70 MG/ML Soaj Generic drug:  Erenumab-aooe INJECT 1-2 PENS SUB-Q MONTHLY   ALPRAZolam 1 MG tablet Commonly known as:  XANAX Take 1 mg by mouth at bedtime as needed for anxiety.   ANTARA 30 MG Caps Generic drug:  Fenofibrate Micronized Take 100 mg by mouth daily.   betamethasone dipropionate 0.05 % ointment Commonly known as:  DIPROLENE APP EXT AA BID FOR 1 TO 2 WKS   buPROPion 150 MG 12 hr tablet Commonly known as:  WELLBUTRIN SR Take 300 mg by mouth daily.   BYSTOLIC 10 MG tablet Generic drug:  nebivolol 20 mg.   desonide 0.05 % cream Commonly known as:  DESOWEN   FETZIMA 120 MG Cp24 Generic drug:  Levomilnacipran HCl ER Take 1 tablet by mouth daily.   hydrOXYzine 25 MG tablet Commonly known as:  ATARAX/VISTARIL TK 1 TO 2 TS PO QHS PRN   LO LOESTRIN FE 1 MG-10 MCG / 10 MCG tablet Generic drug:  Norethindrone-Ethinyl Estradiol-Fe Biphas TAKE 1 TABLET BY MOUTH DAILY.   losartan 50 MG tablet Commonly known as:  COZAAR Take 50 mg by mouth daily.   nystatin-triamcinolone ointment Commonly known as:  MYCOLOG APPLY TOPICALLY BID   omeprazole 20 MG capsule Commonly known as:  PRILOSEC Take 20 mg by mouth daily.   pentosan polysulfate 100 MG capsule Commonly known as:  ELMIRON 2 tablets twice a day   permethrin 5 % cream Commonly known as:  ELIMITE   rizatriptan 10 MG tablet Commonly known as:  MAXALT Take 10 mg by mouth as needed for migraine. May repeat in 2 hours if needed   SUMAtriptan 100 MG tablet Commonly known as:  IMITREX Take 100 mg by mouth every 2 (two) hours as needed for migraine or headache. May repeat in 2 hours if headache persists or recurs.   URIBEL 118 MG Caps Take 1 capsule 4 times daily as needed   ZYRTEC ALLERGY PO Take by mouth as needed.       Birth History: non-contributory  Developmental History: non-contributory.   Past Surgical History: Past Surgical History:    Procedure Laterality Date  . BREAST BIOPSY Right 02/05/2014   Procedure: BREAST BIOPSY;  Surgeon: [name], MD;  Location: [facility];  Service: General;  Laterality: Right;  . BREAST REDUCTION SURGERY  08/28/2011   Procedure: MAMMARY REDUCTION BILATERAL (BREAST);  Surgeon: Mary A Contogiannis;  Location: [facility];  Service: Plastics;  Laterality: Bilateral;  . CHOLECYSTECTOMY    . colpo  11/22/2015   with biopsy  . ORIF DISTAL RADIUS FRACTURE  04/28/2010   left     Family History: Family History  Problem Relation Age of Onset  . Diabetes Father   . Hypertension Father   . Other Father        heart issues  . Hypertension Mother   . Vision loss Brother   . Cancer Paternal Grandmother        breast  .  Emphysema Paternal Grandmother   . Asthma Paternal Grandmother   . COPD Paternal Grandmother   . Heart disease Paternal Grandfather   . Heart attack Paternal Grandfather      Social History: Sherrill lives at home with her family.  She lives in a house that is 47 years old.  There is laminate wood flooring throughout the house.  They have electric heating and central cooling.  There are 3 dogs, 1 cat, 2 ducks, 10 chickens, and 1 [place] household.  There are no dust mite coverings on the bedding.  She does have a history of tobacco exposure, but she stopped around 3 months ago.  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     Review of Systems: a 14-point review of systems is pertinent for what is mentioned in HPI.  Otherwise, all other systems were negative. Constitutional: negative other than that listed in the HPI Eyes: negative other than that listed in the HPI Ears, nose, mouth, throat, and face: negative other than that listed in the HPI Respiratory: negative other than that listed in the  HPI Cardiovascular: negative other than that listed in the HPI Gastrointestinal: negative other than that listed in the HPI Genitourinary: negative other than that listed in the HPI Integument: negative other than that listed in the HPI Hematologic: negative other than that listed in the HPI Musculoskeletal: negative other than that listed in the HPI Neurological: negative other than that listed in the HPI Allergy/Immunologic: negative other than that listed in the HPI    Objective:   Blood pressure (!) 148/110, pulse 98, temperature 98.3 F (36.8 C), temperature source Oral, resp. rate 16, height 5' 3"  (1.6 m), weight 155 lb (70.3 kg), SpO2 96 %. Body mass index is 27.46 kg/m.   Physical Exam:  General: Alert, interactive, in no acute distress. Pleasant female.  Eyes: No conjunctival injection bilaterally, no discharge on the right, no discharge on the left and no Horner-Trantas dots present. PERRL bilaterally. EOMI without pain. No photophobia.  Ears: Right TM pearly gray with normal light reflex, Left TM pearly gray with normal light reflex, Right TM intact without perforation and Left TM intact without perforation.  Nose/Throat: External nose within normal limits and septum midline. Turbinates minimally edematous without discharge. Posterior oropharynx mildly erythematous without cobblestoning in the posterior oropharynx. Tonsils 2+ without exudates.  Tongue without thrush. Neck: Supple without thyromegaly. Trachea midline. Adenopathy: no enlarged lymph nodes appreciated in the anterior cervical, occipital, axillary, epitrochlear, inguinal, or popliteal regions. Lungs: Clear to auscultation without wheezing, rhonchi or rales. No increased work of breathing. CV: Normal S1/S2. No murmurs. Capillary refill <2 seconds.  Abdomen: Nondistended, nontender. No guarding or rebound tenderness. Bowel sounds present in all fields and hypoactive  Skin: Raised erythematous confluent rash around  her waist where her pants met her skin. There are also some roughened patches over her arms. Unfortunately she does not have pictures of the worst rashes which has since apparently cleared up.  Extremities:  No clubbing, cyanosis or edema. Neuro:   Grossly intact. No focal deficits appreciated. Responsive to questions.  Diagnostic studies: none    Salvatore Marvel, MD [facility]

## 2018-10-11 ENCOUNTER — Encounter: Payer: Self-pay | Admitting: Allergy & Immunology

## 2018-10-13 ENCOUNTER — Encounter: Payer: Self-pay | Admitting: Allergy

## 2018-10-13 ENCOUNTER — Ambulatory Visit (INDEPENDENT_AMBULATORY_CARE_PROVIDER_SITE_OTHER): Payer: PRIVATE HEALTH INSURANCE | Admitting: Allergy & Immunology

## 2018-10-13 VITALS — BP 138/88 | Resp 17

## 2018-10-13 DIAGNOSIS — L235 Allergic contact dermatitis due to other chemical products: Secondary | ICD-10-CM | POA: Diagnosis not present

## 2018-10-13 NOTE — Progress Notes (Signed)
    Follow-up Note  RE: MYRAH STRAWDERMAN MRN: [id number] DOB: Oct 21, 1970 Date of Office Visit: 10/13/2018  Primary care provider: Celene Squibb, MD Referring provider: Celene Squibb, MD   Toriann returns to the office today for the patch test placement, given suspected history of contact dermatitis. She was last seen last Friday. See that note for full details.   In the interim, she was feeling bad later on Friday after I saw her. She tells me that her "head was down" and she went to see her PCP. This was felt to be a side effect of a recent blood pressure medication (olmesartan was added after she did not tolerate losartan). She is also on Bystolic. Overall she is feeling better since this medication was stopped. She is going to see her PCP this week to discuss other options. It is unclear why a second ARB was trialed, but she is going to ask for an antihypertensive in a different class.    Diagnostics: True Test patches placed.    Plan:   Allergic contact dermatitis - Instructions provided on care of the patches for the next 48 hours. Lelon Frohlich was instructed to avoid showering for the next 48 hours. Lelon Frohlich will follow up in 48 hours and 96 hours for patch readings.    Salvatore Marvel, MD  All[facility] and [facility] [place]

## 2018-10-13 NOTE — Patient Instructions (Addendum)
1. Allergic dermatitis due to other chemical product - True Test patches placed today. - No showering until the patches are removed on Wednesday. - I will see you in [place] on Wednesday and Friday for readings. - You can use antihistamines such as Zyrtec, but no prednisone or topical steroids like triamcinolone.  2. Return in about 2 days (around 10/15/2018).   Please inform us of any Emergency Department visits, hospitalizations, or changes in symptoms. Call us before going to the ED for breathing or allergy symptoms since we might be able to fit you in for a sick visit. Feel free to contact us anytime with any questions, problems, or concerns.  It was a pleasure to see you again today!  Websites that have reliable patient information: 1. American Academy of Asthma, Allergy, and Immunology: www.aaaai.org 2. Food Allergy Research and Education (FARE): foodallergy.org 3. Mothers of Asthmatics: http://www.asthmacommunitynetwork.org 4. American College of Allergy, Asthma, and Immunology: MonthlyElectricBill.co.uk   Make sure you are registered to vote! If you have moved or changed any of your contact information, you will need to get this updated before voting!    Voter ID laws are POSSIBLY going into effect for the General Election in November 2020! Be prepared! Check out http://levine.com/ for more details.

## 2018-10-15 ENCOUNTER — Encounter: Payer: Self-pay | Admitting: Allergy & Immunology

## 2018-10-15 ENCOUNTER — Ambulatory Visit: Payer: PRIVATE HEALTH INSURANCE | Admitting: Allergy & Immunology

## 2018-10-15 DIAGNOSIS — L235 Allergic contact dermatitis due to other chemical products: Secondary | ICD-10-CM

## 2018-10-15 NOTE — Progress Notes (Signed)
    Follow-up Note  RE: Erica Cox MRN: [id number] DOB: [birth year] Date of Office Visit: 10/15/2018  Primary care provider: Celene Squibb, MD Referring provider: Celene Squibb, MD   Erica Cox returns to the office today for the initial patch test interpretation, given suspected history of contact dermatitis.    Diagnostics:   TRUE TEST 48-hour hour reading: questionable reaction to #27 (Tixocortol-21-pivalate)  Plan:   Allergic contact dermatitis - The patient has been provided detailed information regarding the substances she is sensitive to, as well as products containing the substances.   - Meticulous avoidance of these substances is recommended.  - If avoidance is not possible, the use of barrier creams or lotions is recommended. - If symptoms persist or progress despite meticulous avoidance of the above chemical, Dermatology Referral may be warranted.   Salvatore Marvel, MD  All[facility] and [facility] [place]

## 2018-10-16 ENCOUNTER — Ambulatory Visit: Payer: PRIVATE HEALTH INSURANCE | Admitting: Obstetrics & Gynecology

## 2018-10-17 ENCOUNTER — Encounter: Payer: PRIVATE HEALTH INSURANCE | Admitting: Allergy & Immunology

## 2018-10-17 ENCOUNTER — Ambulatory Visit: Payer: PRIVATE HEALTH INSURANCE | Admitting: Allergy & Immunology

## 2018-10-17 ENCOUNTER — Encounter: Payer: Self-pay | Admitting: Allergy & Immunology

## 2018-10-17 DIAGNOSIS — J310 Chronic rhinitis: Secondary | ICD-10-CM

## 2018-10-17 DIAGNOSIS — L501 Idiopathic urticaria: Secondary | ICD-10-CM

## 2018-10-17 NOTE — Progress Notes (Signed)
    Follow-up Note  RE: Erica Cox MRN: [id number] DOB: 09-16-1970 Date of Office Visit: 10/17/2018  Primary care provider: Celene Squibb, MD Referring provider: Celene Squibb, MD   Erica Cox returns to the office today for the final patch test interpretation, given suspected history of contact dermatitis.    Diagnostics:   TRUE TEST 96-hour hour reading: equivocal reaction to #28 (Gold sodium thiosulfate)  Plan:   Allergic contact dermatitis - The patient has been provided detailed information regarding the substances she is sensitive to, as well as products containing the substances.   - Meticulous avoidance of these substances is recommended.  - If avoidance is not possible, the use of barrier creams or lotions is recommended. - If symptoms persist or progress despite meticulous avoidance of gold, Dermatology Referral may be warranted.  We did spend a lot of time reviewing her rash today.  She had one picture on her phone which we reviewed.  It seems that her rash comes and goes fairly quickly, which did not realize initially when I saw her.  Because of this, this might be more of an urticarial picture rather than a contact dermatitis or eczema picture.  Her rash has never seemed consistent with any of these, however, making this hard to tease out.  Because of this, we will go ahead and get some screening labs for chronic idiopathic urticaria.  We ordered an alpha gal panel, chronic urticaria panel, tryptase, and thyroid antibodies.  I also obtained an environmental allergy panel as well as an ANA.   Salvatore Marvel, MD  Allergy and [facility] [place]

## 2018-10-20 LAB — IGE+ALLERGENS ZONE 2(30)
Alternaria Alternata IgE: <0.1 kU/L
Amer Sycamore IgE Qn: <0.1 kU/L
Aspergillus Fumigatus IgE: <0.1 kU/L
Bermuda Grass IgE: <0.1 kU/L
Cat Dander IgE: <0.1 kU/L
Cedar, Mountain IgE: <0.1 kU/L
Cladosporium Herbarum IgE: <0.1 kU/L
Cockroach, American IgE: <0.1 kU/L
Common Silver Birch IgE: <0.1 kU/L
D Farinae IgE: <0.1 kU/L
D Pteronyssinus IgE: <0.1 kU/L
Dog Dander IgE: <0.1 kU/L
Hickory, White IgE: <0.1 kU/L
IgE (Immunoglobulin E), Serum: 23 IU/mL (ref 6–495)
Johnson Grass IgE: <0.1 kU/L
Maple/Box Elder IgE: <0.1 kU/L
Mucor Racemosus IgE: <0.1 kU/L
Mugwort IgE Qn: <0.1 kU/L
Nettle IgE: <0.1 kU/L
Oak, White IgE: <0.1 kU/L
Penicillium Chrysogen IgE: <0.1 kU/L
Plantain, English IgE: <0.1 kU/L
Ragweed, Short IgE: <0.1 kU/L
Sheep Sorrel IgE Qn: <0.1 kU/L
Stemphylium Herbarum IgE: <0.1 kU/L
Sweet gum IgE RAST Ql: <0.1 kU/L
Timothy Grass IgE: <0.1 kU/L
White Mulberry IgE: <0.1 kU/L

## 2018-10-25 LAB — CHRONIC URTICARIA: CU INDEX: 10.8 — AB (ref <10)

## 2018-10-25 LAB — ALPHA-GAL PANEL
Alpha Gal IgE*: <0.1 kU/L (ref <0.10)
BEEF CLASS INTERPRETATION: 0
Class Interpretation: 0
Class Interpretation: 0
Pork (Sus spp) IgE: <0.1 kU/L (ref <0.35)

## 2018-10-25 LAB — TRYPTASE: Tryptase: 2.8 ug/L (ref 2.2–13.2)

## 2018-10-25 LAB — THYROID ANTIBODIES: Thyroperoxidase Ab SerPl-aCnc: 7 IU/mL (ref 0–34)

## 2018-10-25 LAB — ANA W/REFLEX IF POSITIVE: Anti Nuclear Antibody(ANA): NEGATIVE

## 2018-12-02 ENCOUNTER — Other Ambulatory Visit: Payer: Self-pay | Admitting: Adult Health

## 2019-01-20 ENCOUNTER — Encounter (HOSPITAL_COMMUNITY): Payer: Self-pay | Admitting: Emergency Medicine

## 2019-01-20 ENCOUNTER — Emergency Department (HOSPITAL_COMMUNITY)
Admission: EM | Admit: 2019-01-20 | Discharge: 2019-01-20 | Disposition: A | Discharge status: Home / Self Care | Payer: PRIVATE HEALTH INSURANCE | Attending: Emergency Medicine | Admitting: Emergency Medicine

## 2019-01-20 ENCOUNTER — Other Ambulatory Visit: Payer: Self-pay

## 2019-01-20 ENCOUNTER — Emergency Department (HOSPITAL_COMMUNITY): Payer: PRIVATE HEALTH INSURANCE

## 2019-01-20 DIAGNOSIS — Y999 Unspecified external cause status: Secondary | ICD-10-CM | POA: Diagnosis not present

## 2019-01-20 DIAGNOSIS — I10 Essential (primary) hypertension: Secondary | ICD-10-CM | POA: Diagnosis not present

## 2019-01-20 DIAGNOSIS — Y929 Unspecified place or not applicable: Secondary | ICD-10-CM | POA: Diagnosis not present

## 2019-01-20 DIAGNOSIS — Z23 Encounter for immunization: Secondary | ICD-10-CM | POA: Diagnosis not present

## 2019-01-20 DIAGNOSIS — W5501XA Bitten by cat, initial encounter: Secondary | ICD-10-CM | POA: Diagnosis not present

## 2019-01-20 DIAGNOSIS — Y939 Activity, unspecified: Secondary | ICD-10-CM | POA: Diagnosis not present

## 2019-01-20 DIAGNOSIS — Z79899 Other long term (current) drug therapy: Secondary | ICD-10-CM | POA: Insufficient documentation

## 2019-01-20 DIAGNOSIS — L03113 Cellulitis of right upper limb: Secondary | ICD-10-CM

## 2019-01-20 DIAGNOSIS — S6991XA Unspecified injury of right wrist, hand and finger(s), initial encounter: Secondary | ICD-10-CM | POA: Diagnosis present

## 2019-01-20 MED ORDER — RABIES IMMUNE GLOBULIN 150 UNIT/ML IM INJ
20.0000 [IU]/kg | INJECTION | Freq: Once | INTRAMUSCULAR | Status: DC
  Filled 2019-01-20: qty 9

## 2019-01-20 MED ORDER — RABIES IMMUNE GLOBULIN 150 UNIT/ML IM INJ
INJECTION | INTRAMUSCULAR | Status: AC
  Filled 2019-01-20: qty 10

## 2019-01-20 MED ORDER — TETANUS-DIPHTH-ACELL PERTUSSIS 5-2.5-18.5 LF-MCG/0.5 IM SUSP
0.5000 mL | Freq: Once | INTRAMUSCULAR | Status: AC
  Administered 2019-01-20: 0.5 mL via INTRAMUSCULAR
  Filled 2019-01-20: qty 0.5

## 2019-01-20 MED ORDER — RABIES VACCINE, PCEC IM SUSR
1.0000 mL | Freq: Once | INTRAMUSCULAR | Status: AC
  Administered 2019-01-20: 1 mL via INTRAMUSCULAR
  Filled 2019-01-20 (×2): qty 1

## 2019-01-20 MED ORDER — SODIUM CHLORIDE 0.9 % IV SOLN
1.5000 g | Freq: Once | INTRAVENOUS | Status: AC
  Administered 2019-01-20: 1.5 g via INTRAVENOUS
  Filled 2019-01-20: qty 1.5

## 2019-01-20 MED ORDER — AMPICILLIN-SULBACTAM SODIUM 3 (2-1) G IV SOLR: 3.0000 g | Freq: Once | INTRAVENOUS | Status: DC

## 2019-01-20 MED ORDER — AMOXICILLIN-POT CLAVULANATE 875-125 MG PO TABS: 1.0000 | ORAL_TABLET | Freq: Two times a day (BID) | ORAL | 0 refills | Status: DC

## 2019-01-20 MED ORDER — SODIUM CHLORIDE 0.9 % IV SOLN
3.0000 g | Freq: Once | INTRAVENOUS | Status: DC
  Filled 2019-01-20: qty 3

## 2019-01-20 NOTE — ED Notes (Signed)
[facility] called to inform of cat bite and officer needed

## 2019-01-20 NOTE — ED Triage Notes (Signed)
Pt states Monday morning pt was trying to break up a fight between her dog and a stray cat. While trying to get between the cat and dog the patient scratched her right pointing finger on the cats tooth. Pt does not know if the cat has had her rabies shots or not. Pt's had is red, pt states she cannot feel her finger. Pt able to move her finger.

## 2019-01-20 NOTE — Discharge Instructions (Addendum)
We signed the ER after you had a cat bite injury. As discussed, these wounds are at high risk of having complications from rabies.  You have been given your first rabies vaccine.  You have refused rabies immunoglobulin.  Please read the instructions provided on rabies, if you start noticing any signs that are mentioned -return to the ER immediately.  Also keep a close eye on the cat if it is living near your house.  If there is any aggressive behavior or abnormal behavior by cat, contact the health department for recommendations.  Also return to the ER immediately if you start having streaking rash going up your right forearm, or if you start developing severe swelling to your right hand, have pus drainage from the wound site, fevers or chills.  If you have any allergic reaction to Augmentin -please call the ER and we will try and prescribe you an alternate medication.

## 2019-01-20 NOTE — ED Provider Notes (Addendum)
[facility] EMERGENCY DEPARTMENT Provider Note   CSN: [id number] Arrival date & time: 01/20/19  0533    History   Chief Complaint Chief Complaint  Patient presents with  . Finger Injury    HPI Erica Cox is a 48 y.o. female.     HPI  48 year old female with no significant past medical history comes in a chief complaint of cat bite and resultant pain in her right index finger.  Patient reports that around 8:00 a.m. yesterday she was trying to break up a fight between her dog and a stray cat.  In doing so she sustained injury to her right index finger over the flexor aspect, distal to DIP.  Patient washed her hand with peroxide and put a Band-Aid on.  At 9 PM she started noticing some pain and swelling to her right index finger, and over the course of night her pain and swelling has increased.  Patient is immunocompetent.  She states that the cat has been living in her compound for the last several days, and typically is mild mannered and allows her and her family members to pat her.  Her tetanus status is unknown.  Past Medical History:  Diagnosis Date  . Anxiety   . Breast nodule 12/14/2013   Will get diagnostic mammogram and right [place] if needed  . Contraceptive management 11/11/2015  . Cough 12/28/2015  . Depression   . Eversion of cervix 12/28/2015  . Headache(784.0)    migraines  . Hypertension 11/11/2015  . Macromastia   . Pollen allergies 12/28/2015  . Postcoital bleeding 12/28/2015  . Sciatic pain   . SUI (stress urinary incontinence, female) 12/14/2013  . Vaginal Pap smear, abnormal     Patient Active Problem List   Diagnosis Date Noted  . Postcoital bleeding 12/28/2015  . Cough 12/28/2015  . Pollen allergies 12/28/2015  . Eversion of cervix 12/28/2015  . Stress due to family tension 12/28/2015  . CIN I (cervical intraepithelial neoplasia I) 11/22/2015  . Hypertension 11/11/2015  . Contraceptive management 11/11/2015  . Breast nodule 12/14/2013  . SUI (stress  urinary incontinence, female) 12/14/2013  . MONONEURITIS, LEG 04/17/2010  . THORACIC/LUMBOSACRAL NEURITIS/RADICULITIS UNSPEC 03/28/2010  . HIP PAIN, LEFT 03/15/2010  . HERNIATED LUMBOSACRAL DISC 03/15/2010    Past Surgical History:  Procedure Laterality Date  . BREAST BIOPSY Right 02/05/2014   Procedure: BREAST BIOPSY;  Surgeon: [name], MD;  Location: [facility];  Service: General;  Laterality: Right;  . BREAST REDUCTION SURGERY  08/28/2011   Procedure: MAMMARY REDUCTION BILATERAL (BREAST);  Surgeon: Mary A Contogiannis;  Location: [facility];  Service: Plastics;  Laterality: Bilateral;  . CHOLECYSTECTOMY    . colpo  11/22/2015   with biopsy  . ORIF DISTAL RADIUS FRACTURE  04/28/2010   left     OB History    Gravida  1   Para  0   Term      Preterm      AB  1   Living  0     SAB      TAB      Ectopic      Multiple      Live Births               Home Medications    Prior to Admission medications   Medication Sig Start Date End Date Taking? Authorizing Provider  AIMOVIG 70 MG/ML SOAJ INJECT 1-2 PENS SUB-Q MONTHLY 02/24/18   [provider]  ALPRAZolam (XANAX) 1 MG tablet Take 1 mg by mouth at bedtime as needed for anxiety.    [provider]  betamethasone dipropionate (DIPROLENE) 0.05 % ointment APP EXT AA BID FOR 1 TO 2 WKS 06/30/18   [provider]  buPROPion (WELLBUTRIN SR) 150 MG 12 hr tablet Take 300 mg by mouth daily.  05/03/17   [provider]  BYSTOLIC 10 MG tablet 20 mg.  04/23/18   [provider]  Cetirizine HCl (ZYRTEC ALLERGY PO) Take by mouth as needed.    [provider]  desonide (DESOWEN) 0.05 % cream  06/05/18   [provider]  Fenofibrate Micronized (ANTARA) 30 MG CAPS Take 100 mg by mouth daily.     [provider]  hydrOXYzine (ATARAX/VISTARIL) 25 MG tablet TK 1 TO 2 TS PO QHS PRN 06/05/18   [provider]  Levomilnacipran HCl ER (FETZIMA)  120 MG CP24 Take 1 tablet by mouth daily.    [provider]  LO LOESTRIN FE 1 MG-10 MCG / 10 MCG tablet TAKE 1 TABLET BY MOUTH DAILY. 12/02/18   Estill Dooms, NP  losartan (COZAAR) 50 MG tablet Take 50 mg by mouth daily. 06/27/18   [provider]  Meth-Hyo-M Bl-Na Phos-Ph Sal (URIBEL) 118 MG CAPS Take 1 capsule 4 times daily as needed 03/21/17   Florian Buff, MD  nystatin-triamcinolone ointment (MYCOLOG) APPLY TOPICALLY BID 06/06/18   [provider]  omeprazole (PRILOSEC) 20 MG capsule Take 20 mg by mouth daily.    [provider]  pentosan polysulfate (ELMIRON) 100 MG capsule 2 tablets twice a day 01/29/18   Florian Buff, MD  permethrin (ELIMITE) 5 % cream  06/05/18   [provider]  rizatriptan (MAXALT) 10 MG tablet Take 10 mg by mouth as needed for migraine. May repeat in 2 hours if needed    [provider]  SUMAtriptan (IMITREX) 100 MG tablet Take 100 mg by mouth every 2 (two) hours as needed for migraine or headache. May repeat in 2 hours if headache persists or recurs.    [provider]    Family History Family History  Problem Relation Age of Onset  . Diabetes Father   . Hypertension Father   . Other Father        heart issues  . Hypertension Mother   . Vision loss Brother   . Cancer Paternal Grandmother        breast  . Emphysema Paternal Grandmother   . Asthma Paternal Grandmother   . COPD Paternal Grandmother   . Heart disease Paternal Grandfather   . Heart attack Paternal Grandfather     Social History Social History   Tobacco Use  . Smoking status: Never Smoker  . Smokeless tobacco: Never Used  Substance Use Topics  . Alcohol use: Yes    Comment: occ  . Drug use: No     Allergies   Adhesive [tape] and Amoxicillin   Review of Systems Review of Systems  Constitutional: Positive for activity change. Negative for fever.  Skin: Positive for wound.  Allergic/Immunologic: Negative for  immunocompromised state.  Neurological: Negative for numbness.  All other systems reviewed and are negative.    Physical Exam Updated Vital Signs BP (!) 162/105 Comment: pt has htn  Pulse 74   Temp 98.4 F (36.9 C) (Oral)   Resp 18   Ht 5' 4"  (1.626 m)   Wt 68 kg   LMP  (LMP Unknown)  SpO2 100%   BMI 25.75 kg/m   Physical Exam Vitals signs and nursing note reviewed.  Constitutional:      Appearance: She is well-developed.  HENT:     Head: Normocephalic and atraumatic.  Neck:     Musculoskeletal: Normal range of motion and neck supple.  Cardiovascular:     Rate and Rhythm: Normal rate.  Pulmonary:     Effort: Pulmonary effort is normal.  Abdominal:     General: Bowel sounds are normal.  Musculoskeletal:        General: Swelling and tenderness present. No deformity.     Comments: Patient has a wound at the flexor surface of the right index finger distal to DIP joint. There is no drainage from the wound. Positive erythema, localized edema appreciated over the right index finger. Patient's hand also appears slightly erythematous compared to the contralateral side. Patient is able to flex right index finger over all of her IP joints, and she is able to abduct, adduct.  Skin:    General: Skin is warm and dry.     Findings: Lesion and rash present.     Comments: Right index finger distally has a small wound at the flexor end. Patient able to flex over DIP, PIP and MCP joint. She has mild swelling of the right index finger and there is erythema without warmth to touch.  The distal pulp of the index finger is the most swollen part of the digit.  There is no drainage from the wound.  Neurological:     Mental Status: She is alert and oriented to person, place, and time.      ED Treatments / Results  Labs (all labs ordered are listed, but only abnormal results are displayed) Labs Reviewed  POC URINE PREG, ED    EKG None  Radiology Dg Hand Complete Right  Result  Date: 01/20/2019 CLINICAL DATA:  48 year old female status post penetrating trauma to the right hand from cap/dog bite. EXAM: RIGHT HAND - COMPLETE 3+ VIEW COMPARISON:  None. FINDINGS: Bone mineralization is within normal limits. Normal joint spaces and alignment. No acute osseous abnormality identified. Generalized soft tissue swelling, maximal in the right index finger. No radiopaque foreign body identified. No soft tissue gas. IMPRESSION: Soft tissue swelling with no osseous abnormality identified. Electronically Signed   By: Genevie Kahmya M.D.   On: 01/20/2019 06:00    Procedures Procedures (including critical care time)  Medications Ordered in ED Medications  rabies immune globulin (HYPERAB/KEDRAB) injection 1,350 Units (1,350 Units Intramuscular Not Given 01/20/19 0636)  Tdap (BOOSTRIX) injection 0.5 mL (0.5 mLs Intramuscular Given 01/20/19 1308)  rabies vaccine (RABAVERT) injection 1 mL (1 mL Intramuscular Given 01/20/19 6578)  ampicillin-sulbactam (UNASYN) 1.5 g in sodium chloride 0.9 % 100 mL IVPB (1.5 g Intravenous New Bag/Given 01/20/19 0631)     Initial Impression / Assessment and Plan / ED Course  I have reviewed the triage vital signs and the nursing notes.  Pertinent labs & imaging results that were available during my care of the patient were reviewed by me and considered in my medical decision making (see chart for details).  Clinical Course as of Jan 20 720  Tue Jan 20, 2019  0721 Patient tolerated antibiotics without any severe side effects. She has mild erythema at the iv insertion site but reports that she is allergic to tape   [AN]    Clinical Course User Index [AN] Varney Biles, MD       48 year old immunocompetent  woman comes in after she injured herself with a cat bite.  She does not know the immunization status of the cat and reports that the cat has been living in the compound for the last several days and behaving normally.  The aggressive behavior by the cat was  because of a fight between it and patient's dog.  It appears that she has started developing early signs of infection to her right finger/hand.  Patient will be updated on tetanus. She was given the option of calling animal control and having the cat observed by them for 10 days, but she prefers getting treated for rabies. Unfortunately, patient was not prepared to get multiple rabies immunoglobulin shot, therefore she only wants to get the rabies vaccine.  She reports that she will keep an eye on the cat herself, and if there is any signs of abnormal behavior then she will return to the ER immediately.  She has been made aware that although chances are that the cat does not have rabies, rabies if required leads to fatality 100% of time and patient is willing to take the risk of waiting and watching.  We give her IV Unasyn.  She reported possible nausea as an allergic reaction to amoxicillin in the past.  She tolerated IV Unasyn well. We will discharge her with Augmentin and advised her to come to the ER on day 3 for second round of rabies vaccine and wound recheck.  Strict ER return precautions have been discussed for early return to the ER, and they include any purulent drainage, worsening swelling of the right hand, or severe pain and swelling in the right index finger.  Patient also states that she is quite sure that she was nicked by cats mouth and not scratched.  We have discussed with her that there is another entity called cat scratch disease, where patients will start developing lymphadenopathy and streaking rash.  She will return to the ER if she starts developing those symptoms.  The wound itself does not appear to be a scratch type wound, and there is no signs of scratching anywhere in her right upper extremity, which makes me also to believe that she likely had a bite wound.  Final Clinical Impressions(s) / ED Diagnoses   Final diagnoses:  Cat bite, initial encounter  Cellulitis of  hand, right    ED Discharge Orders    None        Varney Biles, MD 01/20/19 [phone]

## 2019-05-29 ENCOUNTER — Other Ambulatory Visit: Payer: Self-pay | Admitting: Obstetrics & Gynecology

## 2020-01-11 ENCOUNTER — Other Ambulatory Visit: Payer: Self-pay | Admitting: Adult Health

## 2020-02-10 ENCOUNTER — Telehealth: Payer: Self-pay | Admitting: Obstetrics & Gynecology

## 2020-02-10 NOTE — Telephone Encounter (Signed)
[facility] [facility] pharmacy technician from [facility] phone # [phone] called to notify office that medication elmiron will not be covered by insurance starting 03/10/20 alternate medication are amitriptyline or hydroxyzine

## 2020-02-11 NOTE — Telephone Encounter (Signed)
There is no alternative, htose listed are definitely not alternatives, it is a one of a kind med

## 2020-02-12 ENCOUNTER — Other Ambulatory Visit: Payer: Self-pay | Admitting: Orthopedic Surgery

## 2020-02-12 ENCOUNTER — Telehealth: Payer: Self-pay | Admitting: *Deleted

## 2020-02-12 NOTE — Telephone Encounter (Signed)
Pharmacy states at this time the medication is $5 but after July 1, they are unsure.  Patient is due for a refill so they will go ahead and send it to her.

## 2020-04-05 ENCOUNTER — Other Ambulatory Visit: Payer: Self-pay | Admitting: Adult Health

## 2020-04-05 MED ORDER — LO LOESTRIN FE 1 MG-10 MCG / 10 MCG PO TABS: 1.0000 | ORAL_TABLET | Freq: Every day | ORAL | 0 refills | Status: DC

## 2020-04-05 NOTE — Progress Notes (Signed)
Refill lo lo x 1 needs appt,

## 2020-04-25 ENCOUNTER — Encounter: Payer: Self-pay | Admitting: Adult Health

## 2020-04-25 ENCOUNTER — Ambulatory Visit (INDEPENDENT_AMBULATORY_CARE_PROVIDER_SITE_OTHER): Payer: PRIVATE HEALTH INSURANCE | Admitting: Adult Health

## 2020-04-25 ENCOUNTER — Other Ambulatory Visit: Payer: Self-pay

## 2020-04-25 VITALS — BP 128/84 | HR 83 | Ht 65.0 in | Wt 143.0 lb

## 2020-04-25 DIAGNOSIS — Z8742 Personal history of other diseases of the female genital tract: Secondary | ICD-10-CM | POA: Diagnosis not present

## 2020-04-25 DIAGNOSIS — Z3041 Encounter for surveillance of contraceptive pills: Secondary | ICD-10-CM | POA: Diagnosis not present

## 2020-04-25 MED ORDER — LO LOESTRIN FE 1 MG-10 MCG / 10 MCG PO TABS: 1.0000 | ORAL_TABLET | Freq: Every day | ORAL | 4 refills | Status: DC

## 2020-04-25 NOTE — Progress Notes (Signed)
  Subjective:     Patient ID: Erica Cox, female   DOB: Dec 06, 1970, 49 y.o.   MRN: [id number]  HPI Erica Cox is a 49 year old white female,married, G1P0010, in for refills on OCs. Her last pap was 11/11/2015 LSIL +HPV, had colpo 11/22/2015 CIN 1 PCP is [name].  Review of Systems  No periods on OCs  Reviewed past medical,surgical, social and family history. Reviewed medications and allergies.     Objective:   Physical Exam BP 128/84 (BP Location: Left Arm, Patient Position: Sitting, Cuff Size: Normal)   Pulse 83   Ht 5' 5"  (1.651 m)   Wt 143 lb (64.9 kg)   LMP  (LMP Unknown)   BMI 23.80 kg/m  Skin warm and dry. Neck: mid line trachea, normal thyroid, good ROM, no lymphadenopathy noted. Lungs: clear to ausculation bilaterally. Cardiovascular: regular rate and rhythm.  [place] - 04/25/20 0942      Pregnancy Intention Screening   Does the patient want to become pregnant in the next year? No    Does the patient's partner want to become pregnant in the next year? No    Would the patient like to discuss contraceptive options today? Yes      Contraception Wrap Up   Current Method Oral Contraceptive    End Method Oral Contraceptive    Contraception Counseling Provided Yes             Assessment:     1. Encounter for surveillance of contraceptive pills Will refill OCs and gave 3 sample packs of Lo Loestrin Meds ordered this encounter  Medications  . Norethindrone-Ethinyl Estradiol-Fe Biphas (LO LOESTRIN FE) 1 MG-10 MCG / 10 MCG tablet    Sig: Take 1 tablet by mouth daily.    Dispense:  84 tablet    Refill:  4    Order Specific Question:   Supervising Provider    Answer:   Elonda Husky, LUTHER H [[id number]]     2. History of abnormal pap  Plan:     Return 05/06/20 for pap and physical

## 2020-05-06 ENCOUNTER — Other Ambulatory Visit: Payer: PRIVATE HEALTH INSURANCE | Admitting: Adult Health

## 2020-10-20 ENCOUNTER — Other Ambulatory Visit: Payer: Self-pay | Admitting: Obstetrics & Gynecology

## 2020-10-24 ENCOUNTER — Telehealth: Payer: Self-pay | Admitting: *Deleted

## 2020-10-24 NOTE — Telephone Encounter (Signed)
Pt's insurance has denied covering Elmiron 100 mg. What do you advise? Thanks!! [place]

## 2020-10-24 NOTE — Telephone Encounter (Signed)
Pt aware insurance company has denied covering Elmiron. Pt was advised to call pharmacy and ask what the cash price would be. Pt voiced understanding. Erica Cox

## 2020-10-24 NOTE — Telephone Encounter (Signed)
There is not an alternative, elmiron is one of a kind drug

## 2020-10-25 ENCOUNTER — Telehealth: Payer: Self-pay | Admitting: Obstetrics & Gynecology

## 2020-10-25 NOTE — Telephone Encounter (Signed)
Pt advised to check [email or web address] and see if any assistance is available. Pt also [employer]. Pt was advised if she didn't get anywhere with either of those, call office for appt with Dr. Elonda Husky for next step. Pt voiced understanding. [place]

## 2020-10-25 NOTE — Telephone Encounter (Signed)
Pt called to let us know that the pharmacy told her the out of pocket cost for the Rx that was denied is $4092.53 for a 90 day supply  Please advise & notify pt    [place] ([place])

## 2020-10-25 NOTE — Telephone Encounter (Signed)
Amitryptiline is not treatment for IC it is just for pain management  Trust me there is no reasonable alternative, it is one of a kind drug

## 2020-10-27 ENCOUNTER — Telehealth: Payer: Self-pay | Admitting: Adult Health

## 2020-10-27 NOTE — Telephone Encounter (Signed)
Patient called stating that she got a phone call from her insurance stating that they denied her medication, pt was told by her insurance that you could call [phone] to get prior authorization for the medication and it would work. Please contact pt

## 2020-10-28 ENCOUNTER — Telehealth: Payer: Self-pay | Admitting: *Deleted

## 2020-10-28 NOTE — Telephone Encounter (Signed)
I spoke with the Appeal dept regarding Erica Cox's Erica Cox. I gave the info that Erica Cox is not treatment for IC, it is just for pain management. There is no reasonable alternative. Erica Cox is a one of a kind drug per Dr. Elonda Cox. Appeal's have up to 15 days to make a decision. Pt aware. [name]

## 2020-11-09 ENCOUNTER — Telehealth: Payer: Self-pay | Admitting: *Deleted

## 2020-11-09 NOTE — Telephone Encounter (Signed)
Pt's insurance denied Elmiron even after appealing. Dr. Elonda Husky wants pt to try Elavil 25 mg 1qhs x 3 months. Prescription called into [facility] # [phone] with no refills. Pt aware. [place]

## 2020-11-09 NOTE — Telephone Encounter (Signed)
Encounter closed. [place]

## 2021-04-26 ENCOUNTER — Other Ambulatory Visit: Payer: Self-pay | Admitting: Adult Health

## 2021-06-13 ENCOUNTER — Encounter: Payer: Self-pay | Admitting: Adult Health

## 2021-06-13 ENCOUNTER — Other Ambulatory Visit: Payer: Self-pay

## 2021-06-13 ENCOUNTER — Ambulatory Visit (INDEPENDENT_AMBULATORY_CARE_PROVIDER_SITE_OTHER): Payer: PRIVATE HEALTH INSURANCE | Admitting: Adult Health

## 2021-06-13 ENCOUNTER — Other Ambulatory Visit (HOSPITAL_COMMUNITY)
Admission: RE | Admit: 2021-06-13 | Discharge: 2021-06-13 | Disposition: A | Discharge status: Home / Self Care | Payer: No Typology Code available for payment source | Source: Ambulatory Visit | Attending: Adult Health | Admitting: Adult Health

## 2021-06-13 VITALS — BP 154/83 | HR 84 | Ht 65.0 in | Wt 143.0 lb

## 2021-06-13 DIAGNOSIS — N3946 Mixed incontinence: Secondary | ICD-10-CM | POA: Insufficient documentation

## 2021-06-13 DIAGNOSIS — Z1231 Encounter for screening mammogram for malignant neoplasm of breast: Secondary | ICD-10-CM | POA: Insufficient documentation

## 2021-06-13 DIAGNOSIS — Z1151 Encounter for screening for human papillomavirus (HPV): Secondary | ICD-10-CM | POA: Insufficient documentation

## 2021-06-13 DIAGNOSIS — Z3041 Encounter for surveillance of contraceptive pills: Secondary | ICD-10-CM

## 2021-06-13 DIAGNOSIS — Z1211 Encounter for screening for malignant neoplasm of colon: Secondary | ICD-10-CM

## 2021-06-13 DIAGNOSIS — N941 Unspecified dyspareunia: Secondary | ICD-10-CM | POA: Insufficient documentation

## 2021-06-13 DIAGNOSIS — Z01419 Encounter for gynecological examination (general) (routine) without abnormal findings: Secondary | ICD-10-CM | POA: Diagnosis present

## 2021-06-13 DIAGNOSIS — Z1212 Encounter for screening for malignant neoplasm of rectum: Secondary | ICD-10-CM

## 2021-06-13 DIAGNOSIS — N870 Mild cervical dysplasia: Secondary | ICD-10-CM

## 2021-06-13 DIAGNOSIS — Z8742 Personal history of other diseases of the female genital tract: Secondary | ICD-10-CM

## 2021-06-13 DIAGNOSIS — R350 Frequency of micturition: Secondary | ICD-10-CM

## 2021-06-13 DIAGNOSIS — Z124 Encounter for screening for malignant neoplasm of cervix: Secondary | ICD-10-CM

## 2021-06-13 LAB — HEMOCCULT GUIAC POC 1CARD (OFFICE): Fecal Occult Blood, POC: NEGATIVE

## 2021-06-13 MED ORDER — LO LOESTRIN FE 1 MG-10 MCG / 10 MCG PO TABS: 1.0000 | ORAL_TABLET | Freq: Every day | ORAL | 4 refills | Status: DC

## 2021-06-13 NOTE — Progress Notes (Signed)
Patient ID: Erica Cox, female   DOB: Apr 09, 1971, 50 y.o.   MRN: [id number] History of Present Illness: Erica Cox is a 50 year old white female,married, G1P0010, in for a well woman gyn exam and pap.  She had LSIL with +HPV in 2017 and then had colpo with CIN 1, and has not had pap since, has missed appts. She had physical with PCP 01/06/21.  PCP is Pablo Lawrence NP.   Current Medications, Allergies, Past Medical History, Past Surgical History, Family History and Social History were reviewed in [employer] record.     Review of Systems: Patient denies any headaches, hearing loss, fatigue, blurred vision, shortness of breath, chest pain, abdominal pain, problems with bowel movements,  or intercourse. (Has pain with sex, feels like he hits something).No joint pain or mood swings. No periods on OCs. She has urinary frequency and some urinary incontinence, she is on elmiron 2 daily but it does not help as much as 4 daily but insurance won't cover that, she used to get DMSO, but stopped.   Physical Exam:BP (!) 154/83 (BP Location: Right Arm, Patient Position: Sitting, Cuff Size: Normal)   Pulse 84   Ht 5' 5"  (1.651 m)   Wt 143 lb (64.9 kg)   BMI 23.80 kg/m   General:  Well developed, well nourished, no acute distress Skin:  Warm and dry Neck:  Midline trachea, normal thyroid, good ROM, no lymphadenopathy Lungs; Clear to auscultation bilaterally Breast:  No dominant palpable mass, retraction, or nipple discharge,sp reduction, well healed scars. Cardiovascular: Regular rate and rhythm Abdomen:  Soft, non tender, no hepatosplenomegaly Pelvic:  External genitalia is normal in appearance, no lesions.  The vagina is normal in appearance. Urethra has no lesions or masses. The cervix is everted at os and friable with EC brush,pap with HR HPV genotyping performed. Has pain if cervix manipulated, like with sex. Uterus is felt to be normal size, shape, and contour.  No adnexal masses or  tenderness noted.Bladder is non tender, no masses felt. Rectal: Good sphincter tone, no polyps, or hemorrhoids felt.  Hemoccult negative. Extremities/musculoskeletal:  No swelling or varicosities noted, no clubbing or cyanosis Psych:  No mood changes, alert and cooperative,seems happy AA is 0  Fall risk is low Depression screen [facility] 2/9 06/13/2021 10/30/2016 10/10/2016  Decreased Interest 1 0 1  Down, Depressed, Hopeless 1 0 2  PHQ - 2 Score 2 0 3  Altered sleeping 1 - 2  Tired, decreased energy 1 - 2  Change in appetite 1 - 2  Feeling bad or failure about yourself  0 - 1  Trouble concentrating 1 - 2  Moving slowly or fidgety/restless 0 - 1  Suicidal thoughts 0 - 0  PHQ-9 Score 6 - 13   On meds GAD 7 : Generalized Anxiety Score 06/13/2021  Nervous, Anxious, on Edge 1  Control/stop worrying 1  Worry too much - different things 1  Trouble relaxing 1  Restless 0  Easily annoyed or irritable 1  Afraid - awful might happen 1  Total GAD 7 Score 6      Upstream - 06/13/21 1050       Pregnancy Intention Screening   Does the patient want to become pregnant in the next year? No    Does the patient's partner want to become pregnant in the next year? No    Would the patient like to discuss contraceptive options today? No      Contraception Wrap Up  Current Method Oral Contraceptive    End Method Oral Contraceptive    Contraception Counseling Provided No             Examination chaperoned by Celene Squibb LPN  Impression and Plan: 1. Encounter for gynecological examination with Papanicolaou smear of cervix Pap sent  Pap in 3 years if normal GYN physical in 1 year Labs with PCP - Cytology - PAP( [facility])  2. Encounter for screening fecal occult blood testing   3. History of abnormal cervical Pap smear Pap sent   4. Encounter for surveillance of contraceptive pills Will refill OCs Meds ordered this encounter  Medications   Norethindrone-Ethinyl Estradiol-Fe  Biphas (LO LOESTRIN FE) 1 MG-10 MCG / 10 MCG tablet    Sig: Take 1 tablet by mouth daily.    Dispense:  84 tablet    Refill:  4    Order Specific Question:   Supervising Provider    Answer:   Elonda Husky, LUTHER H [[id number]]     5. CIN I (cervical intraepithelial neoplasia I) Pap sent   6. Urinary frequency Referred to Dr Felipa Eth  - Ambulatory referral to Urology  7. Mixed stress and urge urinary incontinence  - Ambulatory referral to Urology  8. Dyspareunia in female Try different positions, with less penetration   9. Screening for colorectal cancer Cologuard ordered - Cologuard  10. Screening mammogram for breast cancer Mammogram scheduled for her at [facility] 06/26/21 at 5:30 pm  - MM 3D SCREEN BREAST BILATERAL; Future

## 2021-06-19 LAB — CYTOLOGY - PAP
Comment: NEGATIVE
Diagnosis: NEGATIVE
High risk HPV: NEGATIVE

## 2021-06-26 ENCOUNTER — Inpatient Hospital Stay (HOSPITAL_COMMUNITY): Admission: RE | Admit: 2021-06-26 | Payer: No Typology Code available for payment source | Source: Ambulatory Visit

## 2021-07-26 LAB — COLOGUARD: COLOGUARD: POSITIVE — AB

## 2021-07-28 ENCOUNTER — Telehealth: Payer: Self-pay | Admitting: Adult Health

## 2021-07-28 ENCOUNTER — Encounter: Payer: Self-pay | Admitting: Adult Health

## 2021-07-28 DIAGNOSIS — R195 Other fecal abnormalities: Secondary | ICD-10-CM | POA: Insufficient documentation

## 2021-07-28 HISTORY — DX: Other fecal abnormalities: R19.5

## 2021-07-28 NOTE — Telephone Encounter (Signed)
Pt aware that cologuard +, will refer to Dr Laural Golden for colonoscopy

## 2021-07-31 ENCOUNTER — Encounter (INDEPENDENT_AMBULATORY_CARE_PROVIDER_SITE_OTHER): Payer: Self-pay | Admitting: *Deleted

## 2021-08-28 ENCOUNTER — Encounter (INDEPENDENT_AMBULATORY_CARE_PROVIDER_SITE_OTHER): Payer: Self-pay

## 2021-08-28 ENCOUNTER — Encounter (INDEPENDENT_AMBULATORY_CARE_PROVIDER_SITE_OTHER): Payer: Self-pay | Admitting: Gastroenterology

## 2021-08-28 ENCOUNTER — Other Ambulatory Visit (INDEPENDENT_AMBULATORY_CARE_PROVIDER_SITE_OTHER): Payer: Self-pay

## 2021-08-28 ENCOUNTER — Ambulatory Visit (INDEPENDENT_AMBULATORY_CARE_PROVIDER_SITE_OTHER): Payer: No Typology Code available for payment source | Admitting: Gastroenterology

## 2021-08-28 ENCOUNTER — Other Ambulatory Visit: Payer: Self-pay

## 2021-08-28 VITALS — BP 145/91 | HR 81 | Temp 98.3°F | Ht 65.0 in | Wt 139.8 lb

## 2021-08-28 DIAGNOSIS — R195 Other fecal abnormalities: Secondary | ICD-10-CM | POA: Diagnosis not present

## 2021-08-28 DIAGNOSIS — K5900 Constipation, unspecified: Secondary | ICD-10-CM

## 2021-08-28 NOTE — Progress Notes (Signed)
Referring Provider: Celene Squibb, MD Primary Care Physician:  Celene Squibb, MD Primary GI Physician: newly established  Chief Complaint  Patient presents with   positive cologuard test    Pt arrives as a new patient for a positive cologuard test. States she has not seen any blood and not having any issues other than constipation. States she has always had trouble with constipation. Takes linzess as needed because if she takes every day stool turns to liquid.    HPI:   Erica Cox is a 50 y.o. female with past medical history of anxiety, depression, HTN, and stress urinary incontinence.  Patient presenting today as new patient, referred for positive cologuard 07/18/21.  Patient states she has never had a colonoscopy and denies any known family history of CRC or colon polyps. She is without melena, or rectal bleeding. She denies any abdominal pain, appetite has decreased some due to her Saxenda, however, She has no unintentional weight loss, she takes omeprazole for her GERD and reports symptoms are well controlled on this. She denies any sob, dizziness or fatigue.   She reports she has never had regular BMs, she has always had issues with constipation. She was started on Linzess 165mg by PCP and takes this a few times a week, as she cannot take it daily because it causes multiple episodes of watery diarrhea. She has never taken the lower dose of Linzess.  Her bowel habits are very random, she may have only 1 BM per week, sometimes 2-3. She denies any abdominal pain or bloating. She does have to strain a lot of times when she does have a BM.   NSAID use: no regular use of NSAIDs, only occasion.  Social hx: no tobacco or etoh Fam hx: no CRC, no liver disease.  Last Colonoscopy:never Last Endoscopy:never  Recommendations:  Scheduled colonoscopy for further evaluation   Past Medical History:  Diagnosis Date   Anxiety    Breast nodule 12/14/2013   Will get diagnostic mammogram and right  [place] needed   Contraceptive management 11/11/2015   Cough 12/28/2015   Depression    Eversion of cervix 12/28/2015   Headache(784.0)    migraines   Hypertension 11/11/2015   Macromastia    Pollen allergies 12/28/2015   Positive colorectal cancer screening using Cologuard test 07/28/2021   07/28/21 Will refer for colonoscopy, with Dr RLaural Golden  Postcoital bleeding 12/28/2015   Sciatic pain    SUI (stress urinary incontinence, female) 12/14/2013   Vaginal Pap smear, abnormal     Past Surgical History:  Procedure Laterality Date   BREAST BIOPSY Right 02/05/2014   Procedure: BREAST BIOPSY;  Surgeon: MJamesetta So MD;  Location: [facility];  Service: General;  Laterality: Right;   BREAST REDUCTION SURGERY  08/28/2011   Procedure: MAMMARY REDUCTION BILATERAL (BREAST);  Surgeon: Mary A Contogiannis;  [facility] [facility]  Service: Plastics;  Laterality: Bilateral;   CHOLECYSTECTOMY     colpo  11/22/2015   with biopsy   left wrist surgery     ORIF DISTAL RADIUS FRACTURE  04/28/2010   left    Current Outpatient Medications  Medication Sig Dispense Refill   AIMOVIG 70 MG/ML SOAJ INJECT 1-2 PENS SUB-Q MONTHLY  5   ALPRAZolam (XANAX) 1 MG tablet Take 1 mg by mouth at bedtime as needed for anxiety.     buPROPion (WELLBUTRIN SR) 150 MG 12 hr tablet Take 300 mg by mouth daily.   3  BYSTOLIC 10 MG tablet 20 mg.   0   Levomilnacipran HCl ER 80 MG CP24 Take 1 tablet by mouth daily.     linaclotide (LINZESS) 145 MCG CAPS capsule Take 145 mcg by mouth. Takes as needed.     Liraglutide -Weight Management (SAXENDA [place]) Inject into the skin.     Norethindrone-Ethinyl Estradiol-Fe Biphas (LO LOESTRIN FE) 1 MG-10 MCG / 10 MCG tablet Take 1 tablet by mouth daily. 84 tablet 4   omeprazole (PRILOSEC) 20 MG capsule Take 20 mg by mouth daily.     phentermine 37.5 MG capsule Take 37.5 mg by mouth every morning.     Pseudoephedrine HCl (SUPHEDRINE PO) Take by mouth. 2m as needed     SUMAtriptan  (IMITREX) 100 MG tablet Take 100 mg by mouth every 2 (two) hours as needed for migraine or headache. May repeat in 2 hours if headache persists or recurs.     topiramate (TOPAMAX) 50 MG tablet Take 50 mg by mouth 2 (two) times daily.     No current facility-administered medications for this visit.    Allergies as of 08/28/2021 - Review Complete 06/13/2021  Allergen Reaction Noted   Adhesive [tape] Hives 08/21/2011   Amoxicillin Nausea And Vomiting 09/24/2017    Family History  Problem Relation Age of Onset   Diabetes Father    Hypertension Father    Other Father        heart issues   Hypertension Mother    Vision loss Brother    Cancer Paternal Grandmother        breast   Emphysema Paternal Grandmother    Asthma Paternal Grandmother    COPD Paternal Grandmother    Heart disease Paternal Grandfather    Heart attack Paternal Grandfather     Social History   Socioeconomic History   Marital status: Married    Spouse name: Not on file   Number of children: Not on file   Years of education: Not on file   Highest education level: Not on file  Occupational History   Not on file  Tobacco Use   Smoking status: Never   Smokeless tobacco: Never  Vaping Use   Vaping Use: Never used  Substance and Sexual Activity   Alcohol use: Not Currently   Drug use: No   Sexual activity: Yes    Birth control/protection: Pill  Other Topics Concern   Not on file  Social History Narrative   Not on file   Social Determinants of Health   Financial Resource Strain: Low Risk    Difficulty of Paying Living Expenses: Not very hard  Food Insecurity: No Food Insecurity   Worried About Running Out of Food in the Last Year: Never true   Ran Out of Food in the Last Year: Never true  Transportation Needs: No Transportation Needs   Lack of Transportation (Medical): No   Lack of Transportation (Non-Medical): No  Physical Activity: Insufficiently Active   Days of Exercise per Week: 1 day    Minutes of Exercise per Session: 20 min  Stress: Stress Concern Present   Feeling of Stress : To some extent  Social Connections: Moderately Isolated   Frequency of Communication with Friends and Family: More than three times a week   Frequency of Social Gatherings with Friends and Family: Twice a week   Attends Religious Services: Never   AMarine scientistor Organizations: No   Attends CArchivistMeetings: Never   Marital Status: Married  Review of systems General: negative for malaise, night sweats, fever, chills, weight loss Neck: Negative for lumps, goiter, pain and significant neck swelling Resp: Negative for cough, wheezing, dyspnea at rest CV: Negative for chest pain, leg swelling, palpitations, orthopnea GI: denies melena, hematochezia, nausea, vomiting, diarrhea, dysphagia, odyonophagia, early satiety or unintentional weight loss. +constipation MSK: Negative for joint pain or swelling, back pain, and muscle pain. Derm: Negative for itching or rash Psych: Denies depression, anxiety, memory loss, confusion. No homicidal or suicidal ideation.  Heme: Negative for prolonged bleeding, bruising easily, and swollen nodes. Endocrine: Negative for cold or heat intolerance, polyuria, polydipsia and goiter. Neuro: negative for tremor, gait imbalance, syncope and seizures. The remainder of the review of systems is noncontributory.  Physical Exam: BP (!) 145/91 (BP Location: Right Arm, Patient Position: Sitting, Cuff Size: Normal)    Pulse 81    Temp 98.3 F (36.8 C) (Oral)    Ht 5' 5"  (1.651 m)    Wt 139 lb 12.8 oz (63.4 kg)    BMI 23.26 kg/m  General:   Alert and oriented. No distress noted. Pleasant and cooperative.  Head:  Normocephalic and atraumatic. Eyes:  Conjuctiva clear without scleral icterus. Mouth:  Oral mucosa pink and moist. Good dentition. No lesions. Heart: Normal rate and rhythm, s1 and s2 heart sounds present.  Lungs: Clear lung sounds in all lobes.  Respirations equal and unlabored. Abdomen:  +BS, soft, non-tender and non-distended. No rebound or guarding. No HSM or masses noted. Derm: No palmar erythema or jaundice Msk:  Symmetrical without gross deformities. Normal posture. Extremities:  Without edema. Neurologic:  Alert and  oriented x4 Psych:  Alert and cooperative. Normal mood and affect.  Invalid input(s): 6 MONTHS   ASSESSMENT: Erica Cox is a 50 y.o. female presenting today as a new patient for further evaluation of positive cologuard.  Patient has never had a colonoscopy before. She had positive cologuard for CRC screening 07/18/21. She states that she has had no melena, rectal bleeding, unintentional weight loss, abdominal pain, fatigue, sob or dizziness. She has no known family history of colon cancer or colon polyps, she is at average CRC risk. I discussed implications of positive cologuard, as well as Indications, risks and benefits of procedure, with patient. Patient verbalized understanding and is in agreement to proceed with Colonoscopy at this time.    PLAN:  Schedule colonoscopy 2. Samples of linzess 54mg, take every day to every other day to help prevent constipation  3. Increase water, fruits/veggies especially kiwi and prunes   Follow Up: TBD after TCS  Tyeesha Riker L. CAlver Sorrow MSN, APRN, AGNP-C Adult-Gerontology Nurse Practitioner [facility]

## 2021-08-28 NOTE — Patient Instructions (Addendum)
We will get you scheduled for colonoscopy for further evaluation of your positive cologuard.  Please make sure you are drinking plenty of water and eating a diet high in fiber (fruits/veggies, especially prunes and kiwi) I am giving you samples of Linzess 30mg, you can try taking this lower dose every day to every other day to see if you tolerate it a little more without so much diarrhea. Linzess works best when taking on a regular basis to help prevent constipation.

## 2021-08-29 ENCOUNTER — Other Ambulatory Visit: Payer: Self-pay | Admitting: Adult Health Nurse Practitioner

## 2021-08-29 DIAGNOSIS — N6310 Unspecified lump in the right breast, unspecified quadrant: Secondary | ICD-10-CM

## 2021-08-31 NOTE — Patient Instructions (Addendum)
Shyna Duignan Imbert  08/31/2021     @PREFPERIOPPHARMACY @   Your procedure is scheduled on  09/06/2021.   Report to [facility] at  0800 A.M.   Call this number if you have problems the morning of surgery:  [phone]   Remember:  Follow the diet and prep instructions given to yo by the office.    Your last dose of saxenda should have been 08/30/2021.     Take these medicines the morning of surgery with A SIP OF WATER                   Xanax(if needed), well butrin, levomilnacipran,  nebivolol, prilosec, imitrex (if needed).     Do not wear jewelry, make-up or nail polish.  Do not wear lotions, powders, or perfumes, or deodorant.  Do not shave 48 hours prior to surgery.  Men may shave face and neck.  Do not bring valuables to the hospital.  [facility] is not responsible for any belongings or valuables.  Contacts, dentures or bridgework may not be worn into surgery.  Leave your suitcase in the car.  After surgery it may be brought to your room.  For patients admitted to the hospital, discharge time will be determined by your treatment team.  Patients discharged the day of surgery will not be allowed to drive home and must have someone with them for 24 hours.    Special instructions:   DO NOT smoke tobacco or vape for 24 hours before your procedure.  Please read over the following fact sheets that you were given. Anesthesia Post-op Instructions and Care and Recovery After Surgery       Colonoscopy, Adult, Care After This sheet gives you information about how to care for yourself after your procedure. Your health care provider may also give you more specific instructions. If you have problems or questions, contact your health care provider. What can I expect after the procedure? After the procedure, it is common to have: A small amount of blood in your stool for 24 hours after the procedure. Some gas. Mild cramping or bloating of your abdomen. Follow these  instructions at home: Eating and drinking  Drink enough fluid to keep your urine pale yellow. Follow instructions from your health care provider about eating or drinking restrictions. Resume your normal diet as instructed by your health care provider. Avoid heavy or fried foods that are hard to digest. Activity Rest as told by your health care provider. Avoid sitting for a long time without moving. Get up to take short walks every 1-2 hours. This is important to improve blood flow and breathing. Ask for help if you feel weak or unsteady. Return to your normal activities as told by your health care provider. Ask your health care provider what activities are safe for you. Managing cramping and bloating  Try walking around when you have cramps or feel bloated. Apply heat to your abdomen as told by your health care provider. Use the heat source that your health care provider recommends, such as a moist heat pack or a heating pad. Place a towel between your skin and the heat source. Leave the heat on for 20-30 minutes. Remove the heat if your skin turns bright red. This is especially important if you are unable to feel pain, heat, or cold. You may have a greater risk of getting burned. General instructions If you were given a sedative during the procedure, it can affect  you for several hours. Do not drive or operate machinery until your health care provider says that it is safe. For the first 24 hours after the procedure: Do not sign important documents. Do not drink alcohol. Do your regular daily activities at a slower pace than normal. Eat soft foods that are easy to digest. Take over-the-counter and prescription medicines only as told by your health care provider. Keep all follow-up visits as told by your health care provider. This is important. Contact a health care provider if: You have blood in your stool 2-3 days after the procedure. Get help right away if you have: More than a small  spotting of blood in your stool. Large blood clots in your stool. Swelling of your abdomen. Nausea or vomiting. A fever. Increasing pain in your abdomen that is not relieved with medicine. Summary After the procedure, it is common to have a small amount of blood in your stool. You may also have mild cramping and bloating of your abdomen. If you were given a sedative during the procedure, it can affect you for several hours. Do not drive or operate machinery until your health care provider says that it is safe. Get help right away if you have a lot of blood in your stool, nausea or vomiting, a fever, or increased pain in your abdomen. This information is not intended to replace advice given to you by your health care provider. Make sure you discuss any questions you have with your health care provider. Document Revised: 07/03/2019 Document Reviewed: 03/23/2019 Elsevier Patient Education  [place] After This sheet gives you information about how to care for yourself after your procedure. Your health care provider may also give you more specific instructions. If you have problems or questions, contact your health care provider. What can I expect after the procedure? After the procedure, it is common to have: Tiredness. Forgetfulness about what happened after the procedure. Impaired judgment for important decisions. Nausea or vomiting. Some difficulty with balance. Follow these instructions at home: For the time period you were told by your health care provider:   Rest as needed. Do not participate in activities where you could fall or become injured. Do not drive or use machinery. Do not drink alcohol. Do not take sleeping pills or medicines that cause drowsiness. Do not make important decisions or sign legal documents. Do not take care of children on your own. Eating and drinking Follow the diet that is recommended by your health care  provider. Drink enough fluid to keep your urine pale yellow. If you vomit: Drink water, juice, or soup when you can drink without vomiting. Make sure you have little or no nausea before eating solid foods. General instructions Have a responsible adult stay with you for the time you are told. It is important to have someone help care for you until you are awake and alert. Take over-the-counter and prescription medicines only as told by your health care provider. If you have sleep apnea, surgery and certain medicines can increase your risk for breathing problems. Follow instructions from your health care provider about wearing your sleep device: Anytime you are sleeping, including during daytime naps. While taking prescription pain medicines, sleeping medicines, or medicines that make you drowsy. Avoid smoking. Keep all follow-up visits as told by your health care provider. This is important. Contact a health care provider if: You keep feeling nauseous or you keep vomiting. You feel light-headed. You are still sleepy or  having trouble with balance after 24 hours. You develop a rash. You have a fever. You have redness or swelling around the IV site. Get help right away if: You have trouble breathing. You have new-onset confusion at home. Summary For several hours after your procedure, you may feel tired. You may also be forgetful and have poor judgment. Have a responsible adult stay with you for the time you are told. It is important to have someone help care for you until you are awake and alert. Rest as told. Do not drive or operate machinery. Do not drink alcohol or take sleeping pills. Get help right away if you have trouble breathing, or if you suddenly become confused. This information is not intended to replace advice given to you by your health care provider. Make sure you discuss any questions you have with your health care provider. Document Revised: 05/12/2020 Document Reviewed:  07/30/2019 Elsevier Patient Education  2022 [employer].

## 2021-09-01 ENCOUNTER — Other Ambulatory Visit: Payer: Self-pay

## 2021-09-01 ENCOUNTER — Encounter (HOSPITAL_COMMUNITY)
Admission: RE | Admit: 2021-09-01 | Discharge: 2021-09-01 | Disposition: A | Discharge status: Home / Self Care | Payer: No Typology Code available for payment source | Source: Ambulatory Visit | Attending: Internal Medicine | Admitting: Internal Medicine

## 2021-09-01 VITALS — BP 138/86 | HR 97 | Temp 98.3°F | Resp 18 | Ht 65.0 in | Wt 139.8 lb

## 2021-09-01 DIAGNOSIS — R195 Other fecal abnormalities: Secondary | ICD-10-CM

## 2021-09-01 DIAGNOSIS — Z01812 Encounter for preprocedural laboratory examination: Secondary | ICD-10-CM | POA: Diagnosis present

## 2021-09-01 DIAGNOSIS — Z01818 Encounter for other preprocedural examination: Secondary | ICD-10-CM

## 2021-09-01 LAB — PREGNANCY, URINE: Preg Test, Ur: NEGATIVE

## 2021-09-06 ENCOUNTER — Ambulatory Visit (HOSPITAL_COMMUNITY)
Admission: RE | Admit: 2021-09-06 | Discharge: 2021-09-06 | Disposition: A | Discharge status: Home / Self Care | Payer: PRIVATE HEALTH INSURANCE | Attending: Internal Medicine | Admitting: Internal Medicine

## 2021-09-06 ENCOUNTER — Ambulatory Visit (HOSPITAL_COMMUNITY): Admit: 2021-09-06 | Payer: No Typology Code available for payment source | Admitting: Gastroenterology

## 2021-09-06 ENCOUNTER — Encounter (HOSPITAL_COMMUNITY): Payer: Self-pay | Admitting: Internal Medicine

## 2021-09-06 ENCOUNTER — Encounter (HOSPITAL_COMMUNITY)
Admission: RE | Disposition: A | Discharge status: Home / Self Care | Payer: Self-pay | Source: Home / Self Care | Attending: Internal Medicine

## 2021-09-06 ENCOUNTER — Encounter (HOSPITAL_COMMUNITY): Payer: Self-pay

## 2021-09-06 ENCOUNTER — Ambulatory Visit (HOSPITAL_COMMUNITY): Payer: PRIVATE HEALTH INSURANCE | Admitting: Anesthesiology

## 2021-09-06 ENCOUNTER — Other Ambulatory Visit: Payer: Self-pay

## 2021-09-06 DIAGNOSIS — K5909 Other constipation: Secondary | ICD-10-CM | POA: Insufficient documentation

## 2021-09-06 DIAGNOSIS — Z79899 Other long term (current) drug therapy: Secondary | ICD-10-CM | POA: Diagnosis not present

## 2021-09-06 DIAGNOSIS — K6389 Other specified diseases of intestine: Secondary | ICD-10-CM

## 2021-09-06 DIAGNOSIS — R195 Other fecal abnormalities: Secondary | ICD-10-CM | POA: Insufficient documentation

## 2021-09-06 DIAGNOSIS — K6289 Other specified diseases of anus and rectum: Secondary | ICD-10-CM

## 2021-09-06 HISTORY — PX: BIOPSY: SHX5522

## 2021-09-06 HISTORY — PX: COLONOSCOPY WITH PROPOFOL: SHX5780

## 2021-09-06 LAB — HM COLONOSCOPY

## 2021-09-06 SURGERY — COLONOSCOPY WITH PROPOFOL
Anesthesia: Monitor Anesthesia Care

## 2021-09-06 SURGERY — COLONOSCOPY WITH PROPOFOL
Anesthesia: General

## 2021-09-06 MED ORDER — LACTATED RINGERS IV SOLN: INTRAVENOUS | Status: DC

## 2021-09-06 MED ORDER — PROPOFOL 10 MG/ML IV BOLUS
INTRAVENOUS | Status: DC | PRN
  Administered 2021-09-06: 60 mg via INTRAVENOUS

## 2021-09-06 MED ORDER — PROPOFOL 500 MG/50ML IV EMUL
INTRAVENOUS | Status: DC | PRN
  Administered 2021-09-06: 150 ug/kg/min via INTRAVENOUS

## 2021-09-06 NOTE — Op Note (Signed)
[facility] Patient Name: Erica Cox Procedure Date: 09/06/2021 9:34 AM MRN: [id number] Date of Birth: November 17, 1970 Attending MD: Hildred Laser , MD CSN: [id number] Age: 50 Admit Type: Outpatient Procedure:                Colonoscopy Indications:              Positive Cologuard test Providers:                Hildred Laser, MD, Lambert Mody, Kristine L.                            Risa Grill, Technician Referring MD:             Delphina Cahill, MD Medicines:                Propofol per Anesthesia Complications:            No immediate complications. Estimated Blood Loss:     Estimated blood loss was minimal. Procedure:                Pre-Anesthesia Assessment:                           - Prior to the procedure, a History and Physical                            was performed, and patient medications and                            allergies were reviewed. The patient's tolerance of                            previous anesthesia was also reviewed. The risks                            and benefits of the procedure and the sedation                            options and risks were discussed with the patient.                            All questions were answered, and informed consent                            was obtained. Prior Anticoagulants: The patient has                            taken no previous anticoagulant or antiplatelet                            agents. ASA Grade Assessment: II - A patient with                            mild systemic disease. After reviewing the risks  and benefits, the patient was deemed in                            satisfactory condition to undergo the procedure.                           After obtaining informed consent, the colonoscope                            was passed under direct vision. Throughout the                            procedure, the patient's blood pressure, pulse, and                            oxygen  saturations were monitored continuously. The                            [id number]-[id number] ([id number]) scope was introduced through                            the anus and advanced to the the cecum, identified                            by appendiceal orifice and ileocecal valve. The                            colonoscopy was performed without difficulty. The                            patient tolerated the procedure well. The quality                            of the bowel preparation was excellent. The                            ileocecal valve, appendiceal orifice, and rectum                            were photographed. Scope In: 10:03:12 AM Scope Out: 10:29:25 AM Scope Withdrawal Time: 0 hours 20 minutes 43 seconds  Total Procedure Duration: 0 hours 26 minutes 13 seconds  Findings:      The perianal and digital rectal examinations were normal.      A diffuse area of mild melanosis was found in the sigmoid colon, in the       descending colon and at the splenic flexure.      An area of mildly congested friable mucosa with erosions was found in       the distal sigmoid colon. This was biopsied with a cold forceps for       histology. The pathology specimen was placed into Bottle Number 1.      An area of mildly congested friable mucosa with erosions was found in       the rectum and in the recto-sigmoid colon. The pathology specimen was  placed into Bottle Number 2.      No additional abnormalities were found on retroflexion. Impression:               - Melanosis in the colon.                           - Congested friable mucosa with erosions mucosa in                            the distal sigmoid colon. Biopsied.                           - Congested friable mucosa with erosions mucosa in                            the rectum and in the recto-sigmoid colon. Biopsied.                           Comment; resolving colitis. ?stercoral. Moderate Sedation:      Per Anesthesia  Care Recommendation:           - Patient has a contact number available for                            emergencies. The signs and symptoms of potential                            delayed complications were discussed with the                            patient. Return to normal activities tomorrow.                            Written discharge instructions were provided to the                            patient.                           - High fiber diet today.                           - Continue present medications.                           - No aspirin, ibuprofen, naproxen, or other                            non-steroidal anti-inflammatory drugs for 1 day.                           - Await pathology results.                           - Repeat colonoscopy date to be determined after  pending pathology results are reviewed. Procedure Code(s):        --- Professional ---                           [phone], Colonoscopy, flexible; with biopsy, single                            or multiple Diagnosis Code(s):        --- Professional ---                           K63.89, Other specified diseases of intestine                           K62.89, Other specified diseases of anus and rectum                           R19.5, Other fecal abnormalities CPT copyright 2019 American Medical Association. All rights reserved. The codes documented in this report are preliminary and upon coder review may  be revised to meet current compliance requirements. Hildred Laser, MD Hildred Laser, MD 09/06/2021 10:40:47 AM This report has been signed electronically. Number of Addenda: 0

## 2021-09-06 NOTE — Anesthesia Preprocedure Evaluation (Signed)
Anesthesia Evaluation  Patient identified by MRN, date of birth, ID band Patient awake    Reviewed: Allergy & Precautions, H&P , NPO status , Patient's Chart, lab work & pertinent test results, reviewed documented beta blocker date and time   Airway Mallampati: II  TM Distance: >3 FB Neck ROM: full    Dental no notable dental hx.    Pulmonary neg pulmonary ROS,    Pulmonary exam normal breath sounds clear to auscultation       Cardiovascular Exercise Tolerance: Good hypertension, negative cardio ROS   Rhythm:regular Rate:Normal     Neuro/Psych  Headaches, PSYCHIATRIC DISORDERS Anxiety Depression  Neuromuscular disease    GI/Hepatic negative GI ROS, Neg liver ROS,   Endo/Other  negative endocrine ROS  Renal/GU negative Renal ROS  negative genitourinary   Musculoskeletal   Abdominal   Peds  Hematology negative hematology ROS (+)   Anesthesia Other Findings   Reproductive/Obstetrics negative OB ROS                             Anesthesia Physical Anesthesia Plan  ASA: 2  Anesthesia Plan: General   Post-op Pain Management:    Induction:   PONV Risk Score and Plan: Propofol infusion  Airway Management Planned:   Additional Equipment:   Intra-op Plan:   Post-operative Plan:   Informed Consent: I have reviewed the patients History and Physical, chart, labs and discussed the procedure including the risks, benefits and alternatives for the proposed anesthesia with the patient or authorized representative who has indicated his/her understanding and acceptance.     Dental Advisory Given  Plan Discussed with: CRNA  Anesthesia Plan Comments:         Anesthesia Quick Evaluation

## 2021-09-06 NOTE — Discharge Instructions (Signed)
No aspirin or NSAIDs for 24 hours.   Resume usual medications as before. High-fiber diet. No driving for 24 hours. Physician will call with biopsy results.

## 2021-09-06 NOTE — Anesthesia Postprocedure Evaluation (Signed)
Anesthesia Post Note  Patient: Erica Cox [facility]  Procedure(s) Performed: COLONOSCOPY WITH PROPOFOL BIOPSY  Patient location during evaluation: Phase II Anesthesia Type: General Level of consciousness: awake Pain management: pain level controlled Vital Signs Assessment: post-procedure vital signs reviewed and stable Respiratory status: spontaneous breathing and respiratory function stable Cardiovascular status: blood pressure returned to baseline and stable Postop Assessment: no headache and no apparent nausea or vomiting Anesthetic complications: no Comments: Late entry   No notable events documented.   Last Vitals:  Vitals:   09/06/21 0814 09/06/21 1035  BP: (!) 121/95 112/87  Pulse: 80 82  Resp: 20 16  Temp: 37.1 C (!) 36.3 C  SpO2: 98% 98%    Last Pain:  Vitals:   09/06/21 1035  TempSrc:   PainSc: 0-No pain                 Louann Sjogren

## 2021-09-06 NOTE — H&P (Signed)
Erica Cox is an 50 y.o. female.   Chief Complaint: Patient is here for colonoscopy. HPI: Patient is 50 year old Caucasian female who underwent Cologuard testing for screening and it came back positive.  She has chronic constipation which she has had for years but has not noted any abdominal pain or rectal bleeding.  She has taken Linzess high-dose in the past and at the time of office visit she was started on a low-dose and advised to take it daily or every other day. Family history is negative for colorectal carcinoma. Patient does not take aspirin or anticoagulants.  Past Medical History:  Diagnosis Date   Anxiety    Breast nodule 12/14/2013   Will get diagnostic mammogram and right [place] if needed   Contraceptive management 11/11/2015   Cough 12/28/2015   Depression    Eversion of cervix 12/28/2015   Headache(784.0)    migraines   Hypertension 11/11/2015   Macromastia    Pollen allergies 12/28/2015   Positive colorectal cancer screening using Cologuard test 07/28/2021   07/28/21 Will refer for colonoscopy, with Dr Laural Golden   Postcoital bleeding 12/28/2015   Sciatic pain    SUI (stress urinary incontinence, female) 12/14/2013   Vaginal Pap smear, abnormal     Past Surgical History:  Procedure Laterality Date   BREAST BIOPSY Right 02/05/2014   Procedure: BREAST BIOPSY;  Surgeon: [name], MD;  Location: [facility];  Service: General;  Laterality: Right;   BREAST REDUCTION SURGERY  08/28/2011   Procedure: MAMMARY REDUCTION BILATERAL (BREAST);  Surgeon: Mary A Contogiannis;  Location: [place];  Service: Plastics;  Laterality: Bilateral;   CHOLECYSTECTOMY     colpo  11/22/2015   with biopsy   left wrist surgery     ORIF DISTAL RADIUS FRACTURE  04/28/2010   left    Family History  Problem Relation Age of Onset   Diabetes Father    Hypertension Father    Other Father        heart issues   Hypertension Mother    Vision loss Brother    Cancer Paternal Grandmother         breast   Emphysema Paternal Grandmother    Asthma Paternal Grandmother    COPD Paternal Grandmother    Heart disease Paternal Grandfather    Heart attack Paternal Grandfather    Social History:  reports that she has never smoked. She has never used smokeless tobacco. She reports that she does not currently use alcohol. She reports that she does not use drugs.  Allergies:  Allergies  Allergen Reactions   Adhesive [Tape] Hives   Amoxicillin Nausea And Vomiting   Other Rash    Epoxy: when still in container    Medications Prior to Admission  Medication Sig Dispense Refill   AIMOVIG 70 MG/ML SOAJ Inject 70 mg into the skin every 30 (thirty) days.  5   buPROPion (WELLBUTRIN XL) 300 MG 24 hr tablet Take 300 mg by mouth daily.     Levomilnacipran HCl ER 80 MG CP24 Take 80 mg by mouth daily.     linaclotide (LINZESS) 72 MCG capsule Take 72 mcg by mouth daily as needed (constipation).     Liraglutide -Weight Management (SAXENDA [place]) Inject 0.6 mg into the skin daily.     Nebivolol HCl 20 MG TABS Take 20 mg by mouth daily.  0   Norethindrone-Ethinyl Estradiol-Fe Biphas (LO LOESTRIN FE) 1 MG-10 MCG / 10 MCG tablet Take 1 tablet by mouth  daily. 84 tablet 4   omeprazole (PRILOSEC) 20 MG capsule Take 20 mg by mouth daily.     Pseudoephedrine HCl (SUPHEDRINE PO) Take 10 mg by mouth daily.     SUMAtriptan (IMITREX) 100 MG tablet Take 100 mg by mouth every 2 (two) hours as needed for migraine or headache. May repeat in 2 hours if headache persists or recurs.     topiramate (TOPAMAX) 50 MG tablet Take 50 mg by mouth at bedtime.     ALPRAZolam (XANAX) 1 MG tablet Take 1 mg by mouth daily as needed for anxiety.      No results found for this or any previous visit (from the past 48 hour(s)). No results found.  Review of Systems  Blood pressure (!) 121/95, pulse 80, temperature 98.8 F (37.1 C), temperature source Oral, resp. rate 20, height 5' 5"  (1.651 m), weight 63.4 kg, SpO2 98 %. Physical  Exam HENT:     Mouth/Throat:     Mouth: Mucous membranes are moist.     Pharynx: Oropharynx is clear.  Eyes:     General: No scleral icterus.    Conjunctiva/sclera: Conjunctivae normal.  Cardiovascular:     Rate and Rhythm: Normal rate and regular rhythm.     Heart sounds: Normal heart sounds. No murmur heard. Pulmonary:     Effort: Pulmonary effort is normal.     Breath sounds: Normal breath sounds.  Abdominal:     General: There is no distension.     Palpations: Abdomen is soft. There is no mass.     Tenderness: There is no abdominal tenderness.  Musculoskeletal:        General: No swelling.     Cervical back: Neck supple.  Lymphadenopathy:     Cervical: No cervical adenopathy.  Skin:    General: Skin is warm and dry.  Neurological:     Mental Status: She is alert.     Assessment/Plan  Positive Cologuard test. Diagnostic colonoscopy under monitored anesthesia care.  Hildred Laser, MD 09/06/2021, 9:50 AM

## 2021-09-06 NOTE — Transfer of Care (Signed)
Immediate Anesthesia Transfer of Care Note  Patient: Erica Cox [facility]  Procedure(s) Performed: COLONOSCOPY WITH PROPOFOL BIOPSY  Patient Location: PACU  Anesthesia Type:General  Level of Consciousness: awake, drowsy and patient cooperative  Airway & Oxygen Therapy: Patient Spontanous Breathing  Post-op Assessment: Report given to RN, Post -op Vital signs reviewed and stable and Patient moving all extremities X 4  Post vital signs: Reviewed and stable  Last Vitals:  Vitals Value Taken Time  BP 112/87 09/06/21 1035  Temp 36.3 C 09/06/21 1035  Pulse 82 09/06/21 1035  Resp 16 09/06/21 1035  SpO2 98 % 09/06/21 1035    Last Pain:  Vitals:   09/06/21 1035  TempSrc:   PainSc: 0-No pain         Complications: No notable events documented.

## 2021-09-08 LAB — SURGICAL PATHOLOGY

## 2021-09-12 ENCOUNTER — Encounter (HOSPITAL_COMMUNITY): Payer: Self-pay | Admitting: Internal Medicine

## 2021-09-14 ENCOUNTER — Encounter (INDEPENDENT_AMBULATORY_CARE_PROVIDER_SITE_OTHER): Payer: Self-pay | Admitting: *Deleted

## 2021-10-05 ENCOUNTER — Other Ambulatory Visit: Payer: Self-pay | Admitting: Adult Health Nurse Practitioner

## 2021-10-05 ENCOUNTER — Ambulatory Visit
Admission: RE | Admit: 2021-10-05 | Discharge: 2021-10-05 | Disposition: A | Discharge status: Home / Self Care | Payer: PRIVATE HEALTH INSURANCE | Source: Ambulatory Visit | Attending: Adult Health Nurse Practitioner | Admitting: Adult Health Nurse Practitioner

## 2021-10-05 ENCOUNTER — Ambulatory Visit
Admission: RE | Admit: 2021-10-05 | Discharge: 2021-10-05 | Disposition: A | Discharge status: Home / Self Care | Payer: No Typology Code available for payment source | Source: Ambulatory Visit | Attending: Adult Health Nurse Practitioner | Admitting: Adult Health Nurse Practitioner

## 2021-10-05 DIAGNOSIS — N6310 Unspecified lump in the right breast, unspecified quadrant: Secondary | ICD-10-CM

## 2021-10-05 DIAGNOSIS — N6489 Other specified disorders of breast: Secondary | ICD-10-CM

## 2021-10-24 ENCOUNTER — Telehealth: Payer: Self-pay | Admitting: *Deleted

## 2021-10-24 MED ORDER — LO LOESTRIN FE 1 MG-10 MCG / 10 MCG PO TABS: 1.0000 | ORAL_TABLET | Freq: Every day | ORAL | 4 refills | Status: DC

## 2021-10-24 NOTE — Telephone Encounter (Signed)
Patient would like for LoLoestrin to be sent to [facility] before trying to submit a PA.

## 2021-10-24 NOTE — Telephone Encounter (Signed)
Refilled lo loestrin

## 2021-11-14 ENCOUNTER — Ambulatory Visit (INDEPENDENT_AMBULATORY_CARE_PROVIDER_SITE_OTHER): Payer: PRIVATE HEALTH INSURANCE | Admitting: Obstetrics & Gynecology

## 2021-11-14 ENCOUNTER — Other Ambulatory Visit: Payer: Self-pay

## 2021-11-14 ENCOUNTER — Encounter: Payer: Self-pay | Admitting: Obstetrics & Gynecology

## 2021-11-14 VITALS — BP 127/84 | HR 72 | Ht 64.0 in | Wt 139.5 lb

## 2021-11-14 DIAGNOSIS — N814 Uterovaginal prolapse, unspecified: Secondary | ICD-10-CM

## 2021-11-14 DIAGNOSIS — N941 Unspecified dyspareunia: Secondary | ICD-10-CM

## 2021-11-14 NOTE — Progress Notes (Unsigned)
Chief Complaint  Patient presents with   discuss having a hyst due to cervical pain      51 y.o. G1P0010 No LMP recorded. (Menstrual status: Oral contraceptives). The current method of family planning is {contraception:315051}.  Outpatient Encounter Medications as of 11/14/2021  Medication Sig Note   ALPRAZolam (XANAX) 1 MG tablet Take 1 mg by mouth daily as needed for anxiety.    buPROPion (WELLBUTRIN XL) 300 MG 24 hr tablet Take 300 mg by mouth daily.    Levomilnacipran HCl ER 80 MG CP24 Take 80 mg by mouth daily.    linaclotide (LINZESS) 72 MCG capsule Take 72 mcg by mouth daily as needed (constipation).    Liraglutide -Weight Management (SAXENDA [place]) Inject 0.6 mg into the skin daily. 08/31/2021: Last dose was 12/21. On hold for procedure.   Nebivolol HCl 20 MG TABS Take 20 mg by mouth daily.    Norethindrone-Ethinyl Estradiol-Fe Biphas (LO LOESTRIN FE) 1 MG-10 MCG / 10 MCG tablet Take 1 tablet by mouth daily.    omeprazole (PRILOSEC) 20 MG capsule Take 20 mg by mouth daily.    Pseudoephedrine HCl (SUPHEDRINE PO) Take 10 mg by mouth daily.    SUMAtriptan (IMITREX) 100 MG tablet Take 100 mg by mouth every 2 (two) hours as needed for migraine or headache. May repeat in 2 hours if headache persists or recurs.    topiramate (TOPAMAX) 50 MG tablet Take 50 mg by mouth at bedtime.    AIMOVIG 70 MG/ML SOAJ Inject 70 mg into the skin every 30 (thirty) days. (Patient not taking: Reported on 11/14/2021)    No facility-administered encounter medications on file as of 11/14/2021.    Subjective *** Past Medical History:  Diagnosis Date   Anxiety    Breast nodule 12/14/2013   Will get diagnostic mammogram and right [place] if needed   Contraceptive management 11/11/2015   Cough 12/28/2015   Depression    Eversion of cervix 12/28/2015   Headache(784.0)    migraines   Hypertension 11/11/2015   Macromastia    Pollen allergies 12/28/2015   Positive colorectal cancer screening using Cologuard test  07/28/2021   07/28/21 Will refer for colonoscopy, with Dr Laural Golden   Postcoital bleeding 12/28/2015   Sciatic pain    SUI (stress urinary incontinence, female) 12/14/2013   Vaginal Pap smear, abnormal     Past Surgical History:  Procedure Laterality Date   BIOPSY  09/06/2021   Procedure: BIOPSY;  Surgeon: Rogene Houston, MD;  [facility]: [facility];  Service: Endoscopy;;   BREAST BIOPSY Right 02/05/2014   Procedure: BREAST BIOPSY;  Surgeon: [name], MD;  Location: [facility];  Service: General;  Laterality: Right;   BREAST REDUCTION SURGERY  08/28/2011   Procedure: MAMMARY REDUCTION BILATERAL (BREAST);  Surgeon: Mary A Contogiannis;  Location: [place];  Service: Plastics;  Laterality: Bilateral;   CHOLECYSTECTOMY     COLONOSCOPY WITH PROPOFOL N/A 09/06/2021   Procedure: COLONOSCOPY WITH PROPOFOL;  Surgeon: Rogene Houston, MD;  [facility]: [facility];  Service: Endoscopy;  Laterality: N/A;  855   colpo  11/22/2015   with biopsy   left wrist surgery     ORIF DISTAL RADIUS FRACTURE  04/28/2010   left    OB History     Gravida  1   Para  0   Term      Preterm      AB  1   Living  0  SAB      IAB      Ectopic      Multiple      Live Births              Allergies  Allergen Reactions   Adhesive [Tape] Hives   Amoxicillin Nausea And Vomiting   Other Rash    Epoxy: when still in container    Social History   Socioeconomic History   Marital status: Married    Spouse name: Not on file   Number of children: Not on file   Years of education: Not on file   Highest education level: Not on file  Occupational History   Not on file  Tobacco Use   Smoking status: Never   Smokeless tobacco: Never  Vaping Use   Vaping Use: Never used  Substance and Sexual Activity   Alcohol use: Not Currently   Drug use: No   Sexual activity: Yes    Birth control/protection: Pill  Other Topics Concern   Not on file  Social History Narrative    Not on file   Social Determinants of Health   Financial Resource Strain: Low Risk    Difficulty of Paying Living Expenses: Not very hard  Food Insecurity: No Food Insecurity   Worried About Running Out of Food in the Last Year: Never true   Ran Out of Food in the Last Year: Never true  Transportation Needs: No Transportation Needs   Lack of Transportation (Medical): No   Lack of Transportation (Non-Medical): No  Physical Activity: Insufficiently Active   Days of Exercise per Week: 1 day   Minutes of Exercise per Session: 20 min  Stress: Stress Concern Present   Feeling of Stress : To some extent  Social Connections: Moderately Isolated   Frequency of Communication with Friends and Family: More than three times a week   Frequency of Social Gatherings with Friends and Family: Twice a week   Attends Religious Services: Never   Marine scientist or Organizations: No   Attends Music therapist: Never   Marital Status: Married    Family History  Problem Relation Age of Onset   Diabetes Father    Hypertension Father    Other Father        heart issues   Hypertension Mother    Vision loss Brother    Cancer Paternal Grandmother        breast   Emphysema Paternal Grandmother    Asthma Paternal Grandmother    COPD Paternal Grandmother    Heart disease Paternal Grandfather    Heart attack Paternal Grandfather     Medications:       Current Outpatient Medications:    ALPRAZolam (XANAX) 1 MG tablet, Take 1 mg by mouth daily as needed for anxiety., Disp: , Rfl:    buPROPion (WELLBUTRIN XL) 300 MG 24 hr tablet, Take 300 mg by mouth daily., Disp: , Rfl:    Levomilnacipran HCl ER 80 MG CP24, Take 80 mg by mouth daily., Disp: , Rfl:    linaclotide (LINZESS) 72 MCG capsule, Take 72 mcg by mouth daily as needed (constipation)., Disp: , Rfl:    Liraglutide -Weight Management (SAXENDA [place]), Inject 0.6 mg into the skin daily., Disp: , Rfl:    Nebivolol HCl 20 MG TABS, Take  20 mg by mouth daily., Disp: , Rfl: 0   Norethindrone-Ethinyl Estradiol-Fe Biphas (LO LOESTRIN FE) 1 MG-10 MCG / 10 MCG tablet, Take 1 tablet by  mouth daily., Disp: 84 tablet, Rfl: 4   omeprazole (PRILOSEC) 20 MG capsule, Take 20 mg by mouth daily., Disp: , Rfl:    Pseudoephedrine HCl (SUPHEDRINE PO), Take 10 mg by mouth daily., Disp: , Rfl:    SUMAtriptan (IMITREX) 100 MG tablet, Take 100 mg by mouth every 2 (two) hours as needed for migraine or headache. May repeat in 2 hours if headache persists or recurs., Disp: , Rfl:    topiramate (TOPAMAX) 50 MG tablet, Take 50 mg by mouth at bedtime., Disp: , Rfl:    AIMOVIG 70 MG/ML SOAJ, Inject 70 mg into the skin every 30 (thirty) days. (Patient not taking: Reported on 11/14/2021), Disp: , Rfl: 5  Objective Blood pressure 127/84, pulse 72, height 5' 4"  (1.626 m), weight 139 lb 8 oz (63.3 kg).  ***  Pertinent ROS ***  Labs or studies ***    Impression Diagnoses this Encounter::   ICD-10-CM   1. Dyspareunia in female, bump 100% interruption for 1 year  N94.10       Established relevant diagnosis(es): ***  Plan/Recommendations: No orders of the defined types were placed in this encounter.   Labs or Scans Ordered: No orders of the defined types were placed in this encounter.   Management:: ***  Follow up No follow-ups on file.       All questions were answered.

## 2021-12-07 ENCOUNTER — Encounter: Payer: Self-pay | Admitting: Obstetrics & Gynecology

## 2021-12-13 NOTE — Patient Instructions (Signed)
? ? ? ? ? ? ? ? Erica Cox Latin ? 12/13/2021  ?  ? @PREFPERIOPPHARMACY @ ? ? Your procedure is scheduled on 12/20/2021. ? ? Report to [facility] at  0700 A.M. ? ? Call this number if you have problems the morning of surgery: ? [phone] ? ? Remember: ? Do not eat after midnight. ? ? You may drink clear liquids until 0445 AM on 12/20/2021.   ? ? ?Clear liquids allowed are:                    Water, Juice (non-citric and without pulp - diabetics please choose diet or no sugar options), Carbonated beverages - (diabetics please choose diet or no sugar options), Clear Tea, Black Coffee only (no creamer, milk or cream including half and half), Plain Jell-O only (diabetics please choose diet or no sugar options), Gatorade (diabetics please choose diet or no sugar options), and Plain Popsicles only ?  ? ?At 0445 AM on 12/20/2021, drink your carb drink. You can have nothing else to drink after this. ? ? ? Take these medicines the morning of surgery with A SIP OF WATER  ? ?xanax(if needed), wellbutrin, levomilnacipram, nebivolol, prilosec. ?  ? Do not wear jewelry, make-up or nail polish. ? Do not wear lotions, powders, or perfumes, or deodorant. ? Do not shave 48 hours prior to surgery.  Men may shave face and neck. ? Do not bring valuables to the hospital. ? [facility] is not responsible for any belongings or valuables. ? ?Contacts, dentures or bridgework may not be worn into surgery.  Leave your suitcase in the car.  After surgery it may be brought to your room. ? ?For patients admitted to the hospital, discharge time will be determined by your treatment team. ? ?Patients discharged the day of surgery will not be allowed to drive home and must have someone with them for 24 hours.  ? ? ?Special instructions:   DO NOT smoke tobacco or vape for 24 hours before your procedure. ? ?Please read over the following fact sheets that you were given. ?Coughing and Deep Breathing, Surgical Site Infection Prevention, Anesthesia Post-op  Instructions, and Care and Recovery After Surgery ?  ? ? ? Vaginal Hysterectomy, Care After ?The following information offers guidance on how to care for yourself after your procedure. Your health care provider may also give you more specific instructions. If you have problems or questions, contact your health care provider. ?What can I expect after the procedure? ?After the procedure, it is common to have: ?Pain in the lower abdomen and vagina. ?Vaginal bleeding and discharge for up to 1 week. You will need to use a sanitary pad after this procedure. ?Difficulty having a bowel movement (constipation). ?Temporary problems emptying the bladder. ?Tiredness (fatigue). ?Poor appetite. ?Less interest in sex. ?Feelings of sadness or other emotions. ?If your ovaries were also removed, it is also common to have symptoms of menopause, such as hot flashes, night sweats, and lack of sleep (insomnia). ?Follow these instructions at home: ?Medicines ? ?Take over-the-counter and prescription medicines only as told by your health care provider. ?Do not take aspirin or NSAIDs, such as ibuprofen. These medicines can cause bleeding. ?Ask your health care provider if the medicine prescribed to you: ?Requires you to avoid driving or using machinery. ?Can cause constipation. You may need to take these actions to prevent or treat constipation: ?Drink enough fluid to keep your urine pale yellow. ?Take over-the-counter or prescription medicines. ?Eat  foods that are high in fiber, such as beans, whole grains, and fresh fruits and vegetables. ?Limit foods that are high in fat and processed sugars, such as fried or sweet foods. ?Activity ? ?Rest as told by your health care provider. ?Return to your normal activities as told by your health care provider. Ask your health care provider what activities are safe for you ?Avoid sitting for a long time without moving. Get up to take short walks every 1-2 hours. This is important to improve blood flow  and breathing. Ask for help if you feel weak or unsteady. ?Try to have someone home with you for 1-2 weeks to help you with everyday chores. ?Do not lift anything that is heavier than 10 lb (4.5 kg), or the limit that you are told, until your health care provider says that it is safe. ?If you were given a sedative during the procedure, it can affect you for several hours. Do not drive or operate machinery until your health care provider says that it is safe. ?Lifestyle ?Do not use any products that contain nicotine or tobacco. These products include cigarettes, chewing tobacco, and vaping devices, such as e-cigarettes. These can delay healing after surgery. If you need help quitting, ask your health care provider. ?Do not drink alcohol until your health care provider approves. ?General instructions ?Do not douche, use tampons, or have sex for at least 6 weeks, or as told by your health care provider. ?If you struggle with physical or emotional changes after your procedure, speak with your health care provider or a therapist. ?The stitches inside your vagina will dissolve over time and do not need to be taken out. ?Do not take baths, swim, or use a hot tub until your health care provider approves. You may only be allowed to take showers for 2-3 weeks ?Wear compression stockings as told by your health care provider. These stockings help to prevent blood clots and reduce swelling in your legs. ?Keep all follow-up visits. This is important. ?Contact a health care provider if: ?Your pain medicine is not helping. ?You have a fever. ?You have nausea or vomiting that does not go away. ?You feel dizzy. ?You have blood, pus, or a bad-smelling discharge from your vagina more than 1 week after the procedure. ?You continue to have trouble urinating 3-5 days after the procedure. ?Get help right away if: ?You have severe pain in your abdomen or back. ?You faint. ?You have heavy vaginal bleeding and blood clots, soaking through a  sanitary pad in less than 1 hour. ?You have chest pain or shortness of breath. ?You have pain, swelling, or redness in your leg. ?These symptoms may represent a serious problem that is an emergency. Do not wait to see if the symptoms will go away. Get medical help right away. Call your local emergency services (911 in the [place] Do not drive yourself to the hospital. ?Summary ?After the procedure, it is common to have pain, vaginal bleeding, constipation, temporary problems emptying your bladder, and feelings of sadness or other emotions. ?Take over-the-counter and prescription medicines only as told by your health care provider. ?Rest as told by your health care provider. Return to your normal activities as told by your health care provider. ?Contact a health care provider if your pain medicine is not helping, or you have a fever, dizziness, or trouble urinating several days after the procedure. ?Get help right away if you have severe pain in your abdomen or back, or if you faint, have  heavy bleeding, or have chest pain or shortness of breath. ?This information is not intended to replace advice given to you by your health care provider. Make sure you discuss any questions you have with your health care provider. ?Document Revised: 04/29/2020 Document Reviewed: 04/29/2020 ?Elsevier Patient Education ? [employer]. ?General Anesthesia, Adult, Care After ?This sheet gives you information about how to care for yourself after your procedure. Your health care provider may also give you more specific instructions. If you have problems or questions, contact your health care provider. ?What can I expect after the procedure? ?After the procedure, the following side effects are common: ?Pain or discomfort at the IV site. ?Nausea. ?Vomiting. ?Sore throat. ?Trouble concentrating. ?Feeling cold or chills. ?Feeling weak or tired. ?Sleepiness and fatigue. ?Soreness and body aches. These side effects can affect parts of the  body that were not involved in surgery. ?Follow these instructions at home: ?For the time period you were told by your health care provider: ? ?Rest. ?Do not participate in activities where you could fall o

## 2021-12-18 ENCOUNTER — Encounter (HOSPITAL_COMMUNITY): Payer: Self-pay

## 2021-12-18 ENCOUNTER — Other Ambulatory Visit: Payer: Self-pay | Admitting: Obstetrics & Gynecology

## 2021-12-18 ENCOUNTER — Encounter (HOSPITAL_COMMUNITY)
Admission: RE | Admit: 2021-12-18 | Discharge: 2021-12-18 | Disposition: A | Discharge status: Home / Self Care | Payer: PRIVATE HEALTH INSURANCE | Source: Ambulatory Visit | Attending: Obstetrics & Gynecology | Admitting: Obstetrics & Gynecology

## 2021-12-18 DIAGNOSIS — Z01818 Encounter for other preprocedural examination: Secondary | ICD-10-CM | POA: Insufficient documentation

## 2021-12-18 HISTORY — DX: Other specified postprocedural states: Z98.890

## 2021-12-18 LAB — CBC
HCT: 48.2 % — ABNORMAL HIGH (ref 36.0–46.0)
Hemoglobin: 15.4 g/dL — ABNORMAL HIGH (ref 12.0–15.0)
MCH: 29.6 pg (ref 26.0–34.0)
MCHC: 32 g/dL (ref 30.0–36.0)
MCV: 92.7 fL (ref 80.0–100.0)
Platelets: 245 10*3/uL (ref 150–400)
RBC: 5.2 MIL/uL — ABNORMAL HIGH (ref 3.87–5.11)
RDW: 13.3 % (ref 11.5–15.5)
WBC: 5 10*3/uL (ref 4.0–10.5)
nRBC: 0 % (ref 0.0–0.2)

## 2021-12-18 LAB — URINALYSIS, ROUTINE W REFLEX MICROSCOPIC
Bilirubin Urine: NEGATIVE
Glucose, UA: NEGATIVE mg/dL
Hgb urine dipstick: NEGATIVE
Ketones, ur: NEGATIVE mg/dL
Nitrite: NEGATIVE
Protein, ur: NEGATIVE mg/dL
Specific Gravity, Urine: 1.023 (ref 1.005–1.030)
pH: 5 (ref 5.0–8.0)

## 2021-12-18 LAB — RAPID HIV SCREEN (HIV 1/2 AB+AG)
HIV 1/2 Antibodies: NONREACTIVE
HIV-1 P24 Antigen - HIV24: NONREACTIVE

## 2021-12-18 LAB — TYPE AND SCREEN
ABO/RH(D): O NEG
Antibody Screen: NEGATIVE

## 2021-12-18 LAB — COMPREHENSIVE METABOLIC PANEL
ALT: 23 U/L (ref 0–44)
AST: 18 U/L (ref 15–41)
Albumin: 4 g/dL (ref 3.5–5.0)
Alkaline Phosphatase: 58 U/L (ref 38–126)
Anion gap: 6 (ref 5–15)
BUN: 13 mg/dL (ref 6–20)
CO2: 23 mmol/L (ref 22–32)
Calcium: 9.2 mg/dL (ref 8.9–10.3)
Chloride: 111 mmol/L (ref 98–111)
Creatinine, Ser: 1.07 mg/dL — ABNORMAL HIGH (ref 0.44–1.00)
GFR, Estimated: >60 mL/min (ref >60)
Glucose, Bld: 110 mg/dL — ABNORMAL HIGH (ref 70–99)
Potassium: 4.4 mmol/L (ref 3.5–5.1)
Sodium: 140 mmol/L (ref 135–145)
Total Bilirubin: 0.4 mg/dL (ref 0.3–1.2)
Total Protein: 7 g/dL (ref 6.5–8.1)

## 2021-12-18 LAB — HCG, QUANTITATIVE, PREGNANCY: hCG, Beta Chain, Quant, S: <1 m[IU]/mL (ref <5)

## 2021-12-20 ENCOUNTER — Other Ambulatory Visit: Payer: Self-pay

## 2021-12-20 ENCOUNTER — Ambulatory Visit (HOSPITAL_BASED_OUTPATIENT_CLINIC_OR_DEPARTMENT_OTHER): Payer: PRIVATE HEALTH INSURANCE | Admitting: Anesthesiology

## 2021-12-20 ENCOUNTER — Encounter (HOSPITAL_COMMUNITY)
Admission: RE | Disposition: A | Discharge status: Home / Self Care | Payer: Self-pay | Source: Home / Self Care | Attending: Obstetrics & Gynecology

## 2021-12-20 ENCOUNTER — Encounter (HOSPITAL_COMMUNITY): Payer: Self-pay | Admitting: Obstetrics & Gynecology

## 2021-12-20 ENCOUNTER — Ambulatory Visit (HOSPITAL_COMMUNITY): Payer: PRIVATE HEALTH INSURANCE | Admitting: Anesthesiology

## 2021-12-20 ENCOUNTER — Ambulatory Visit (HOSPITAL_COMMUNITY)
Admission: RE | Admit: 2021-12-20 | Discharge: 2021-12-20 | Disposition: A | Discharge status: Home / Self Care | Payer: PRIVATE HEALTH INSURANCE | Attending: Obstetrics & Gynecology | Admitting: Obstetrics & Gynecology

## 2021-12-20 DIAGNOSIS — K219 Gastro-esophageal reflux disease without esophagitis: Secondary | ICD-10-CM | POA: Diagnosis not present

## 2021-12-20 DIAGNOSIS — N941 Unspecified dyspareunia: Secondary | ICD-10-CM | POA: Diagnosis present

## 2021-12-20 DIAGNOSIS — I10 Essential (primary) hypertension: Secondary | ICD-10-CM | POA: Insufficient documentation

## 2021-12-20 DIAGNOSIS — N814 Uterovaginal prolapse, unspecified: Secondary | ICD-10-CM

## 2021-12-20 HISTORY — PX: VAGINAL HYSTERECTOMY: SHX2639

## 2021-12-20 LAB — ABO/RH: ABO/RH(D): O NEG

## 2021-12-20 SURGERY — HYSTERECTOMY, VAGINAL
Anesthesia: General | Site: Vagina

## 2021-12-20 MED ORDER — DEXAMETHASONE SODIUM PHOSPHATE 10 MG/ML IJ SOLN
INTRAMUSCULAR | Status: AC
  Filled 2021-12-20: qty 1

## 2021-12-20 MED ORDER — LIDOCAINE HCL (CARDIAC) PF 100 MG/5ML IV SOSY
PREFILLED_SYRINGE | INTRAVENOUS | Status: DC | PRN
  Administered 2021-12-20: 60 mg via INTRAVENOUS

## 2021-12-20 MED ORDER — OXYCODONE-ACETAMINOPHEN 5-325 MG PO TABS: 1.0000 | ORAL_TABLET | ORAL | 0 refills | Status: DC | PRN

## 2021-12-20 MED ORDER — SODIUM CHLORIDE (PF) 0.9 % IJ SOLN
INTRAMUSCULAR | Status: AC
  Filled 2021-12-20: qty 10

## 2021-12-20 MED ORDER — PHENYLEPHRINE 40 MCG/ML (10ML) SYRINGE FOR IV PUSH (FOR BLOOD PRESSURE SUPPORT)
PREFILLED_SYRINGE | INTRAVENOUS | Status: AC
  Filled 2021-12-20: qty 10

## 2021-12-20 MED ORDER — KETOROLAC TROMETHAMINE 10 MG PO TABS: 10.0000 mg | ORAL_TABLET | Freq: Three times a day (TID) | ORAL | 0 refills | Status: DC | PRN

## 2021-12-20 MED ORDER — FENTANYL CITRATE PF 50 MCG/ML IJ SOSY: 50.0000 ug | PREFILLED_SYRINGE | INTRAMUSCULAR | Status: DC | PRN

## 2021-12-20 MED ORDER — 0.9 % SODIUM CHLORIDE (POUR BTL) OPTIME
TOPICAL | Status: DC | PRN
Start: 2021-12-20 — End: 2021-12-20
  Administered 2021-12-20: 1000 mL

## 2021-12-20 MED ORDER — ONDANSETRON HCL 4 MG/2ML IJ SOLN
INTRAMUSCULAR | Status: DC | PRN
  Administered 2021-12-20: 4 mg via INTRAVENOUS

## 2021-12-20 MED ORDER — BUPIVACAINE-EPINEPHRINE (PF) 0.5% -1:200000 IJ SOLN
INTRAMUSCULAR | Status: DC | PRN
Start: 2021-12-20 — End: 2021-12-20
  Administered 2021-12-20: 22 mL via PERINEURAL

## 2021-12-20 MED ORDER — SUGAMMADEX SODIUM 500 MG/5ML IV SOLN
INTRAVENOUS | Status: AC
  Filled 2021-12-20: qty 5

## 2021-12-20 MED ORDER — HYDROMORPHONE HCL 1 MG/ML IJ SOLN
INTRAMUSCULAR | Status: AC
  Filled 2021-12-20: qty 0.5

## 2021-12-20 MED ORDER — PROMETHAZINE HCL 25 MG/ML IJ SOLN
INTRAMUSCULAR | Status: DC | PRN
  Administered 2021-12-20: 5 mg via INTRAVENOUS

## 2021-12-20 MED ORDER — ORAL CARE MOUTH RINSE: 15.0000 mL | Freq: Once | OROMUCOSAL | Status: AC

## 2021-12-20 MED ORDER — ONDANSETRON 8 MG PO TBDP
8.0000 mg | ORAL_TABLET | Freq: Three times a day (TID) | ORAL | 0 refills | Status: DC | PRN
Start: 2021-12-20 — End: 2022-01-01

## 2021-12-20 MED ORDER — ONDANSETRON HCL 4 MG/2ML IJ SOLN
INTRAMUSCULAR | Status: AC
  Filled 2021-12-20: qty 2

## 2021-12-20 MED ORDER — CHLORHEXIDINE GLUCONATE 0.12 % MT SOLN
15.0000 mL | Freq: Once | OROMUCOSAL | Status: AC
  Administered 2021-12-20: 15 mL via OROMUCOSAL

## 2021-12-20 MED ORDER — KETOROLAC TROMETHAMINE 30 MG/ML IJ SOLN
30.0000 mg | Freq: Once | INTRAMUSCULAR | Status: AC
Start: 2021-12-20 — End: 2021-12-20
  Administered 2021-12-20: 30 mg via INTRAVENOUS
  Filled 2021-12-20: qty 1

## 2021-12-20 MED ORDER — CIPROFLOXACIN HCL 500 MG PO TABS: 500.0000 mg | ORAL_TABLET | Freq: Two times a day (BID) | ORAL | 0 refills | Status: DC

## 2021-12-20 MED ORDER — FENTANYL CITRATE (PF) 100 MCG/2ML IJ SOLN
INTRAMUSCULAR | Status: AC
Start: 2021-12-20 — End: ?
  Filled 2021-12-20: qty 2

## 2021-12-20 MED ORDER — OXYCODONE-ACETAMINOPHEN 5-325 MG PO TABS: 1.0000 | ORAL_TABLET | ORAL | Status: DC | PRN

## 2021-12-20 MED ORDER — SCOPOLAMINE 1 MG/3DAYS TD PT72
MEDICATED_PATCH | TRANSDERMAL | Status: AC
  Administered 2021-12-20: 1.5 mg via TRANSDERMAL
  Filled 2021-12-20: qty 1

## 2021-12-20 MED ORDER — HYDROMORPHONE HCL 1 MG/ML IJ SOLN: 0.5000 mg | INTRAMUSCULAR | Status: DC | PRN

## 2021-12-20 MED ORDER — CEFAZOLIN SODIUM-DEXTROSE 2-4 GM/100ML-% IV SOLN
2.0000 g | INTRAVENOUS | Status: AC
Start: 2021-12-20 — End: 2021-12-20
  Administered 2021-12-20: 2 g via INTRAVENOUS
  Filled 2021-12-20: qty 100

## 2021-12-20 MED ORDER — BUPIVACAINE-EPINEPHRINE (PF) 0.5% -1:200000 IJ SOLN
INTRAMUSCULAR | Status: AC
  Filled 2021-12-20: qty 30

## 2021-12-20 MED ORDER — POVIDONE-IODINE 10 % EX SWAB: 2.0000 "application " | Freq: Once | CUTANEOUS | Status: DC

## 2021-12-20 MED ORDER — ROCURONIUM BROMIDE 10 MG/ML (PF) SYRINGE
PREFILLED_SYRINGE | INTRAVENOUS | Status: DC | PRN
  Administered 2021-12-20: 60 mg via INTRAVENOUS

## 2021-12-20 MED ORDER — SODIUM CHLORIDE 0.9 % IV SOLN
25.0000 mg | Freq: Four times a day (QID) | INTRAVENOUS | Status: DC | PRN
  Filled 2021-12-20: qty 1

## 2021-12-20 MED ORDER — PROPOFOL 500 MG/50ML IV EMUL
INTRAVENOUS | Status: DC | PRN
  Administered 2021-12-20: 20 ug/kg/min via INTRAVENOUS

## 2021-12-20 MED ORDER — ROCURONIUM BROMIDE 10 MG/ML (PF) SYRINGE
PREFILLED_SYRINGE | INTRAVENOUS | Status: AC
  Filled 2021-12-20: qty 10

## 2021-12-20 MED ORDER — PHENYLEPHRINE HCL (PRESSORS) 10 MG/ML IV SOLN
INTRAVENOUS | Status: DC | PRN
  Administered 2021-12-20 (×4): 40 ug via INTRAVENOUS

## 2021-12-20 MED ORDER — SODIUM CHLORIDE 0.9 % IR SOLN
Status: DC | PRN
Start: 2021-12-20 — End: 2021-12-20
  Administered 2021-12-20: 3000 mL

## 2021-12-20 MED ORDER — METOCLOPRAMIDE HCL 5 MG/ML IJ SOLN
10.0000 mg | Freq: Once | INTRAMUSCULAR | Status: AC
  Administered 2021-12-20: 10 mg via INTRAVENOUS
  Filled 2021-12-20: qty 2

## 2021-12-20 MED ORDER — LIDOCAINE HCL (PF) 2 % IJ SOLN
INTRAMUSCULAR | Status: AC
  Filled 2021-12-20: qty 5

## 2021-12-20 MED ORDER — DEXAMETHASONE SODIUM PHOSPHATE 10 MG/ML IJ SOLN
INTRAMUSCULAR | Status: DC | PRN
  Administered 2021-12-20: 8 mg via INTRAVENOUS

## 2021-12-20 MED ORDER — STERILE WATER FOR IRRIGATION IR SOLN
Status: DC | PRN
  Administered 2021-12-20: 1000 mL

## 2021-12-20 MED ORDER — PROPOFOL 10 MG/ML IV BOLUS
INTRAVENOUS | Status: DC | PRN
  Administered 2021-12-20: 150 mg via INTRAVENOUS

## 2021-12-20 MED ORDER — PROPOFOL 10 MG/ML IV BOLUS
INTRAVENOUS | Status: AC
  Filled 2021-12-20: qty 20

## 2021-12-20 MED ORDER — LACTATED RINGERS IV SOLN: INTRAVENOUS | Status: DC

## 2021-12-20 MED ORDER — SUGAMMADEX SODIUM 200 MG/2ML IV SOLN
INTRAVENOUS | Status: DC | PRN
  Administered 2021-12-20: 122 mg via INTRAVENOUS

## 2021-12-20 MED ORDER — PROMETHAZINE HCL 25 MG/ML IJ SOLN
INTRAMUSCULAR | Status: AC
  Filled 2021-12-20: qty 1

## 2021-12-20 MED ORDER — KETOROLAC TROMETHAMINE 30 MG/ML IJ SOLN
30.0000 mg | Freq: Once | INTRAMUSCULAR | Status: AC
Start: 2021-12-20 — End: 2021-12-20
  Administered 2021-12-20: 30 mg via INTRAMUSCULAR
  Filled 2021-12-20: qty 1

## 2021-12-20 MED ORDER — HYDROMORPHONE HCL 1 MG/ML IJ SOLN
INTRAMUSCULAR | Status: DC | PRN
  Administered 2021-12-20: .5 mg via INTRAVENOUS

## 2021-12-20 MED ORDER — SCOPOLAMINE 1 MG/3DAYS TD PT72: 1.0000 | MEDICATED_PATCH | Freq: Once | TRANSDERMAL | Status: DC

## 2021-12-20 SURGICAL SUPPLY — 42 items
APPLIER CLIP 13 LRG OPEN (CLIP)
APR CLP LRG 13 20 CLIP (CLIP)
BAG HAMPER (MISCELLANEOUS) ×3 IMPLANT
CLIP APPLIE 13 LRG OPEN (CLIP) IMPLANT
CLOTH BEACON ORANGE TIMEOUT ST (SAFETY) ×3 IMPLANT
COVER LIGHT HANDLE STERIS (MISCELLANEOUS) ×6 IMPLANT
DRAPE HALF SHEET 40X57 (DRAPES) ×3 IMPLANT
DRAPE STERI URO 9X17 APER PCH (DRAPES) ×3 IMPLANT
ELECT REM PT RETURN 9FT ADLT (ELECTROSURGICAL) ×2
ELECTRODE REM PT RTRN 9FT ADLT (ELECTROSURGICAL) ×2 IMPLANT
GAUZE 4X4 16PLY ~~LOC~~+RFID DBL (SPONGE) ×4 IMPLANT
GLOVE BIOGEL PI IND STRL 6.5 (GLOVE) IMPLANT
GLOVE BIOGEL PI IND STRL 7.0 (GLOVE) IMPLANT
GLOVE BIOGEL PI IND STRL 8 (GLOVE) IMPLANT
GLOVE BIOGEL PI INDICATOR 6.5 (GLOVE) ×1
GLOVE BIOGEL PI INDICATOR 7.0 (GLOVE) ×4
GLOVE BIOGEL PI INDICATOR 8 (GLOVE) ×1
GLOVE ECLIPSE 8.0 STRL XLNG CF (GLOVE) ×4 IMPLANT
GLOVE SURG SS PI 6.5 STRL IVOR (GLOVE) ×1 IMPLANT
GLOVE SURG SS PI 7.0 STRL IVOR (GLOVE) ×2 IMPLANT
GOWN STRL REUS W/TWL LRG LVL3 (GOWN DISPOSABLE) ×6 IMPLANT
GOWN STRL REUS W/TWL XL LVL3 (GOWN DISPOSABLE) ×3 IMPLANT
IV NS IRRIG 3000ML ARTHROMATIC (IV SOLUTION) ×3 IMPLANT
KIT BLADEGUARD II DBL (SET/KITS/TRAYS/PACK) ×3 IMPLANT
KIT TURNOVER CYSTO (KITS) ×3 IMPLANT
MANIFOLD NEPTUNE II (INSTRUMENTS) ×3 IMPLANT
NDL HYPO 21X1.5 SAFETY (NEEDLE) ×2 IMPLANT
NEEDLE HYPO 21X1.5 SAFETY (NEEDLE) ×2 IMPLANT
NS IRRIG 1000ML POUR BTL (IV SOLUTION) ×3 IMPLANT
PACK PERI GYN (CUSTOM PROCEDURE TRAY) ×3 IMPLANT
PAD ARMBOARD 7.5X6 YLW CONV (MISCELLANEOUS) ×3 IMPLANT
SET BASIN LINEN APH (SET/KITS/TRAYS/PACK) ×3 IMPLANT
SUT MNCRL+ AB 3-0 CT1 36 (SUTURE) IMPLANT
SUT MON AB 3-0 SH 27 (SUTURE) IMPLANT
SUT MONOCRYL AB 3-0 CT1 36IN (SUTURE)
SUT VIC AB 0 CT1 27 (SUTURE) ×4
SUT VIC AB 0 CT1 27XCR 8 STRN (SUTURE) ×4 IMPLANT
SYR CONTROL 10ML LL (SYRINGE) ×3 IMPLANT
TRAY FOLEY W/BAG SLVR 16FR (SET/KITS/TRAYS/PACK) ×2
TRAY FOLEY W/BAG SLVR 16FR ST (SET/KITS/TRAYS/PACK) IMPLANT
VERSALIGHT (MISCELLANEOUS) ×3 IMPLANT
WATER STERILE IRR 1000ML POUR (IV SOLUTION) ×3 IMPLANT

## 2021-12-20 NOTE — Transfer of Care (Signed)
Immediate Anesthesia Transfer of Care Note ? ?Patient: Erica Cox ? ?Procedure(s) Performed: HYSTERECTOMY VAGINAL (Vagina ) ? ?Patient Location: PACU ? ?Anesthesia Type:General ? ?Level of Consciousness: awake, alert  and oriented ? ?Airway & Oxygen Therapy: Patient Spontanous Breathing and Patient connected to nasal cannula oxygen ? ?Post-op Assessment: Report given to RN, Post -op Vital signs reviewed and stable, Patient moving all extremities X 4 and Patient able to stick tongue midline ? ?Post vital signs: Reviewed  ? ? ?Last Vitals:  ?Vitals Value Taken Time  ?BP 88/61 12/20/21 0954  ?Temp 97.9   ?Pulse 74 12/20/21 0956  ?Resp 12 12/20/21 0956  ?SpO2 98 % 12/20/21 0956  ?Vitals shown include unvalidated device data. ? ?Last Pain:  ?Vitals:  ? 12/20/21 0710  ?PainSc: 0-No pain  ?   ? ?  ? ?Complications: No notable events documented. ?

## 2021-12-20 NOTE — Anesthesia Procedure Notes (Signed)
Procedure Name: Intubation ?Date/Time: 12/20/2021 8:32 AM ?Performed by: Maude Leriche, CRNA ?Pre-anesthesia Checklist: Patient identified, Emergency Drugs available, Suction available and Patient being monitored ?Patient Re-evaluated:Patient Re-evaluated prior to induction ?Oxygen Delivery Method: Circle system utilized ?Preoxygenation: Pre-oxygenation with 100% oxygen ?Induction Type: IV induction ?Ventilation: Mask ventilation without difficulty ?Laryngoscope Size: Sabra Heck and 2 ?Grade View: Grade II ?Tube type: Oral ?Number of attempts: 1 ?Airway Equipment and Method: Bite block and Stylet ?Placement Confirmation: ETT inserted through vocal cords under direct vision, positive ETCO2 and breath sounds checked- equal and bilateral ?Secured at: 21 cm ?Tube secured with: Tape ?Dental Injury: Teeth and Oropharynx as per pre-operative assessment  ? ? ? ? ?

## 2021-12-20 NOTE — Anesthesia Postprocedure Evaluation (Signed)
Anesthesia Post Note ? ?Patient: Driana Dazey [facility] ? ?Procedure(s) Performed: HYSTERECTOMY VAGINAL (Vagina ) ? ?Patient location during evaluation: Phase II ?Anesthesia Type: General ?Level of consciousness: awake and alert and oriented ?Pain management: pain level controlled ?Vital Signs Assessment: post-procedure vital signs reviewed and stable ?Respiratory status: spontaneous breathing, nonlabored ventilation and respiratory function stable ?Cardiovascular status: blood pressure returned to baseline and stable ?Postop Assessment: no apparent nausea or vomiting ?Anesthetic complications: no ? ? ?No notable events documented. ? ? ?Last Vitals:  ?Vitals:  ? 12/20/21 1030 12/20/21 1045  ?BP: 112/78 106/75  ?Pulse: 93 89  ?Resp: 18 (!) 21  ?Temp:    ?SpO2: 100% 100%  ?  ?Last Pain:  ?Vitals:  ? 12/20/21 1030  ?PainSc: 0-No pain  ? ? ?  ?  ?  ?  ?  ?  ? ?Malaysha Arlen C Jaeanna Mccomber ? ? ? ? ?

## 2021-12-20 NOTE — Anesthesia Preprocedure Evaluation (Signed)
Anesthesia Evaluation  ?Patient identified by MRN, date of birth, ID band ?Patient awake ? ? ? ?Reviewed: ?Allergy & Precautions, NPO status , Patient's Chart, lab work & pertinent test results, reviewed documented beta blocker date and time  ? ?History of Anesthesia Complications ?(+) PONV and history of anesthetic complications ? ?Airway ?Mallampati: II ? ?TM Distance: >3 FB ?Neck ROM: Full ? ? ? Dental ? ?(+) Dental Advisory Given, Teeth Intact ?  ?Pulmonary ?neg pulmonary ROS,  ?  ?Pulmonary exam normal ?breath sounds clear to auscultation ? ? ? ? ? ? Cardiovascular ?Exercise Tolerance: Good ?hypertension, Pt. on medications and Pt. on home beta blockers ?Normal cardiovascular exam ?Rhythm:Regular Rate:Normal ? ? ?  ?Neuro/Psych ? Headaches, PSYCHIATRIC DISORDERS Anxiety Depression  Neuromuscular disease   ? GI/Hepatic ?Neg liver ROS, GERD  Medicated and Controlled,  ?Endo/Other  ?negative endocrine ROS ? Renal/GU ?negative Renal ROS  ?negative genitourinary ?  ?Musculoskeletal ?negative musculoskeletal ROS ?(+)  ? Abdominal ?  ?Peds ?negative pediatric ROS ?(+)  Hematology ?negative hematology ROS ?(+)   ?Anesthesia Other Findings ? ? Reproductive/Obstetrics ?negative OB ROS ? ?  ? ? ? ? ? ? ? ? ? ? ? ? ? ?  ?  ? ? ? ? ? ? ? ?Anesthesia Physical ?Anesthesia Plan ? ?ASA: 2 ? ?Anesthesia Plan: General  ? ?Post-op Pain Management: Dilaudid IV  ? ?Induction: Intravenous ? ?PONV Risk Score and Plan: 4 or greater and Ondansetron, Dexamethasone, Midazolam and Scopolamine patch - Pre-op ? ?Airway Management Planned: Oral ETT ? ?Additional Equipment:  ? ?Intra-op Plan:  ? ?Post-operative Plan: Extubation in OR ? ?Informed Consent: I have reviewed the patients History and Physical, chart, labs and discussed the procedure including the risks, benefits and alternatives for the proposed anesthesia with the patient or authorized representative who has indicated his/her understanding and  acceptance.  ? ? ? ?Dental advisory given ? ?Plan Discussed with: CRNA and Surgeon ? ?Anesthesia Plan Comments:   ? ? ? ? ? ? ?Anesthesia Quick Evaluation ? ?

## 2021-12-20 NOTE — Op Note (Signed)
Preoperative diagnosis:  1.  Dyspareunia, bump, 100% ?                                        2.  Grade 1 uterine prolapse ?                                        ? ?Postoperative diagnosis:  Same as above  ? ?Procedure:  Vaginal hysterectomy ? ?Surgeon:  Florian Buff MD ? ?Anesthesia:  General Endotracheal ? ?Findings:  normal uterus and ovaries, normal peritoneum ? ? ? ?Description of operation:  The patient was taken from the preoperative area to the operating room in stable condition. She  underwent a general anesthetic. Once an adequate level of anesthesia was attained she was placed in the dorsal lithotomy position. Patient was prepped and draped in the usual sterile fashion and a Foley catheter was placed.  A weighted speculum was placed and the cervix was grasped with thyroid tenaculums both anteriorly and posteriorly.  0.5% Marcaine plain was injected in a circumferential fashion about the cervix and the electrocautery unit was used to incise the vagina and push at all cervix.  The posterior cul-de-sac was then entered sharply without difficulty.  The uterosacral ligaments were clamped cut and inspection suture ligated and held.  The cardinal ligaments were then clamped cut transfixion suture ligated and cut. The anterior peritoneum was identified the anterior cul-de-sac was entered sharply without difficulty. The anterior and posterior leaves of the broad ligament were plicated and the uterine vessels were clamped cut and suture ligated. Serial pedicles were taken of the fundus with each pedicle being clamped cut and suture ligated. The utero-ovarian ligaments were crossclamped the uterus was removed and both pedicles were transfixion suture ligated. There was good hemostasis of all the pedicles. The peritoneum was then closed in a pursestring fashion using 3-0 Vicryl. The anterior posterior vagina was closed in interrupted fashion with good resultant hemostasis. Prior to closure the lower pelvis and  vagina were irrigated vigorously.  The sponge needle and instrument counts were correct x 3.  Total blood loss for the procedure was 100 cc.  The patient received 2 g of Ancef and 30 mg of Toradol IV preoperatively prophylactically.  She was taken to the recovery room in good stable condition awake alert doing well.  Foley out, plan to discharge from the PACU ? ?[place] ?12/20/2021, 9:49 AM ? ?

## 2021-12-20 NOTE — H&P (Signed)
?Preoperative History and Physical ? ?Erica Cox is a 51 y.o. G1P0010 with No LMP recorded. (Menstrual status: Oral contraceptives). admitted for a TVH.  ? ? ?Pt with pain with intercourse now for just over 1 year, 100% ?Deep thrusting ?Not insertional ?Uses lubrication, no improvement ?Has history of IC, stable/quiet ?On COC with good cycle control ? ?PMH:    ?Past Medical History:  ?Diagnosis Date  ? Anxiety   ? Breast nodule 12/14/2013  ? Will get diagnostic mammogram and right [place] if needed  ? Contraceptive management 11/11/2015  ? Cough 12/28/2015  ? Depression   ? Eversion of cervix 12/28/2015  ? Headache(784.0)   ? migraines  ? Hypertension 11/11/2015  ? Macromastia   ? Pollen allergies 12/28/2015  ? PONV (postoperative nausea and vomiting)   ? Positive colorectal cancer screening using Cologuard test 07/28/2021  ? 07/28/21 Will refer for colonoscopy, with Dr Laural Golden  ? Postcoital bleeding 12/28/2015  ? Sciatic pain   ? SUI (stress urinary incontinence, female) 12/14/2013  ? Vaginal Pap smear, abnormal   ? ? ?PSH:     ?Past Surgical History:  ?Procedure Laterality Date  ? BIOPSY  09/06/2021  ? Procedure: BIOPSY;  Surgeon: Rogene Houston, MD;  Location: [facility];  Service: Endoscopy;;  ? BREAST BIOPSY Right 02/05/2014  ? Procedure: BREAST BIOPSY;  Surgeon: [name], MD;  Location: [facility];  Service: General;  Laterality: Right;  ? BREAST REDUCTION SURGERY  08/28/2011  ? Procedure: MAMMARY REDUCTION BILATERAL (BREAST);  Surgeon: Mary A Contogiannis;  Location: [facility];  Service: Plastics;  Laterality: Bilateral;  ? CHOLECYSTECTOMY    ? COLONOSCOPY WITH PROPOFOL N/A 09/06/2021  ? Procedure: COLONOSCOPY WITH PROPOFOL;  Surgeon: Rogene Houston, MD;  Location: [facility];  Service: Endoscopy;  Laterality: N/A;  855  ? colpo  11/22/2015  ? with biopsy  ? left wrist surgery    ? ORIF DISTAL RADIUS FRACTURE  04/28/2010  ? left  ? ? ?POb/GynH:      ?OB History   ? ? Gravida  ?1  ? Para   ?0  ? Term  ?   ? Preterm  ?   ? AB  ?1  ? Living  ?0  ?  ? ? SAB  ?   ? IAB  ?   ? Ectopic  ?   ? Multiple  ?   ? Live Births  ?   ?   ?  ?  ? ? ?SH:   ?Social History  ? ?Tobacco Use  ? Smoking status: Never  ? Smokeless tobacco: Never  ?Vaping Use  ? Vaping Use: Never used  ?Substance Use Topics  ? Alcohol use: Not Currently  ? Drug use: No  ? ? ?FH:    ?Family History  ?Problem Relation Age of Onset  ? Diabetes Father   ? Hypertension Father   ? Other Father   ?     heart issues  ? Hypertension Mother   ? Vision loss Brother   ? Cancer Paternal Grandmother   ?     breast  ? Emphysema Paternal Grandmother   ? Asthma Paternal Grandmother   ? COPD Paternal Grandmother   ? Heart disease Paternal Grandfather   ? Heart attack Paternal Grandfather   ? ? ? ?Allergies:  ?Allergies  ?Allergen Reactions  ? Adhesive [Tape] Hives  ? Amoxicillin Nausea And Vomiting  ? Other Rash  ?  Epoxy: when still  in container  ? ? ?Medications:       ?Current Facility-Administered Medications:  ?  ceFAZolin (ANCEF) IVPB 2g/100 mL premix, 2 g, Intravenous, On Call to OR, Florian Buff, MD ?  chlorhexidine (PERIDEX) 0.12 % solution 15 mL, 15 mL, Mouth/Throat, Once **OR** MEDLINE mouth rinse, 15 mL, Mouth Rinse, Once, Battula, Rajamani C, MD ?  ketorolac (TORADOL) 30 MG/ML injection 30 mg, 30 mg, Intravenous, Once, Florian Buff, MD ?  lactated ringers infusion, , Intravenous, Continuous, Battula, Rajamani C, MD ?  povidone-iodine 10 % swab 2 application., 2 application., Topical, Once, Darria Corvera, Mertie Clause, MD ? ?Review of Systems:  ? ?Review of Systems  ?Constitutional: Negative for fever, chills, weight loss, malaise/fatigue and diaphoresis.  ?HENT: Negative for hearing loss, ear pain, nosebleeds, congestion, sore throat, neck pain, tinnitus and ear discharge.   ?Eyes: Negative for blurred vision, double vision, photophobia, pain, discharge and redness.  ?Respiratory: Negative for cough, hemoptysis, sputum production, shortness of breath,  wheezing and stridor.   ?Cardiovascular: Negative for chest pain, palpitations, orthopnea, claudication, leg swelling and PND.  ?Gastrointestinal: Positive for abdominal pain. Negative for heartburn, nausea, vomiting, diarrhea, constipation, blood in stool and melena.  ?Genitourinary: Negative for dysuria, urgency, frequency, hematuria and flank pain.  ?Musculoskeletal: Negative for myalgias, back pain, joint pain and falls.  ?Skin: Negative for itching and rash.  ?Neurological: Negative for dizziness, tingling, tremors, sensory change, speech change, focal weakness, seizures, loss of consciousness, weakness and headaches.  ?Endo/Heme/Allergies: Negative for environmental allergies and polydipsia. Does not bruise/bleed easily.  ?Psychiatric/Behavioral: Negative for depression, suicidal ideas, hallucinations, memory loss and substance abuse. The patient is not nervous/anxious and does not have insomnia.   ? ? ? ?PHYSICAL EXAM: ? ?Height 5' 4"  (1.626 m), weight 61 kg. ? ?  ?Vitals reviewed. ?Constitutional: She is oriented to person, place, and time. She appears well-developed and well-nourished.  ?HENT:  ?Head: Normocephalic and atraumatic.  ?Right Ear: External ear normal.  ?Left Ear: External ear normal.  ?Nose: Nose normal.  ?Mouth/Throat: Oropharynx is clear and moist.  ?Eyes: Conjunctivae and EOM are normal. Pupils are equal, round, and reactive to light. Right eye exhibits no discharge. Left eye exhibits no discharge. No scleral icterus.  ?Neck: Normal range of motion. Neck supple. No tracheal deviation present. No thyromegaly present.  ?Cardiovascular: Normal rate, regular rhythm, normal heart sounds and intact distal pulses.  Exam reveals no gallop and no friction rub.   ?No murmur heard. ?Respiratory: Effort normal and breath sounds normal. No respiratory distress. She has no wheezes. She has no rales. She exhibits no tenderness.  ?GI: Soft. Bowel sounds are normal. She exhibits no distension and no mass.  There is tenderness. There is no rebound and no guarding.  ?Genitourinary:  ?     Vulva is normal without lesions ?Vagina is pink moist without discharge ?Cervix normal in appearance and pap is normal ?Uterus is normal size and shape grade 1 prolapse, tender with cervical anterior manipulation ?Adnexa is negative with normal sized ovaries by sonogram  ?Musculoskeletal: Normal range of motion. She exhibits no edema and no tenderness.  ?Neurological: She is alert and oriented to person, place, and time. She has normal reflexes. She displays normal reflexes. No cranial nerve deficit. She exhibits normal muscle tone. Coordination normal.  ?Skin: Skin is warm and dry. No rash noted. No erythema. No pallor.  ?Psychiatric: She has a normal mood and affect. Her behavior is normal. Judgment and thought content normal.  ? ? ?  Labs: ?Results for orders placed or performed during the hospital encounter of 12/18/21 (from the past 336 hour(s))  ?CBC  ? Collection Time: 12/18/21  9:23 AM  ?Result Value Ref Range  ? WBC 5.0 4.0 - 10.5 K/uL  ? RBC 5.20 (H) 3.87 - 5.11 MIL/uL  ? Hemoglobin 15.4 (H) 12.0 - 15.0 g/dL  ? HCT 48.2 (H) 36.0 - 46.0 %  ? MCV 92.7 80.0 - 100.0 fL  ? MCH 29.6 26.0 - 34.0 pg  ? MCHC 32.0 30.0 - 36.0 g/dL  ? RDW 13.3 11.5 - 15.5 %  ? Platelets 245 150 - 400 K/uL  ? nRBC 0.0 0.0 - 0.2 %  ?Comprehensive metabolic panel  ? Collection Time: 12/18/21  9:23 AM  ?Result Value Ref Range  ? Sodium 140 135 - 145 mmol/L  ? Potassium 4.4 3.5 - 5.1 mmol/L  ? Chloride 111 98 - 111 mmol/L  ? CO2 23 22 - 32 mmol/L  ? Glucose, Bld 110 (H) 70 - 99 mg/dL  ? BUN 13 6 - 20 mg/dL  ? Creatinine, Ser 1.07 (H) 0.44 - 1.00 mg/dL  ? Calcium 9.2 8.9 - 10.3 mg/dL  ? Total Protein 7.0 6.5 - 8.1 g/dL  ? Albumin 4.0 3.5 - 5.0 g/dL  ? AST 18 15 - 41 U/L  ? ALT 23 0 - 44 U/L  ? Alkaline Phosphatase 58 38 - 126 U/L  ? Total Bilirubin 0.4 0.3 - 1.2 mg/dL  ? GFR, Estimated >60 >60 mL/min  ? Anion gap 6 5 - 15  ?hCG, quantitative, pregnancy  ?  Collection Time: 12/18/21  9:23 AM  ?Result Value Ref Range  ? hCG, Beta Chain, Quant, S <1 <5 mIU/mL  ?Rapid HIV screen (HIV 1/2 Ab+Ag)  ? Collection Time: 12/18/21  9:23 AM  ?Result Value Ref Range  ? HIV-1 P

## 2021-12-21 ENCOUNTER — Encounter (HOSPITAL_COMMUNITY): Payer: Self-pay | Admitting: Obstetrics & Gynecology

## 2021-12-21 LAB — SURGICAL PATHOLOGY

## 2022-01-01 ENCOUNTER — Ambulatory Visit (INDEPENDENT_AMBULATORY_CARE_PROVIDER_SITE_OTHER): Payer: PRIVATE HEALTH INSURANCE | Admitting: Obstetrics & Gynecology

## 2022-01-01 ENCOUNTER — Encounter: Payer: Self-pay | Admitting: Obstetrics & Gynecology

## 2022-01-01 VITALS — BP 139/85 | HR 86 | Ht 64.0 in | Wt 143.0 lb

## 2022-01-01 DIAGNOSIS — Z9889 Other specified postprocedural states: Secondary | ICD-10-CM

## 2022-01-01 NOTE — Progress Notes (Signed)
?  HPI: ?Patient returns for routine postoperative follow-up having undergone TVH on 12/20/21.  ?The patient's immediate postoperative recovery has been unremarkable. ?Since hospital discharge the patient reports no prbolems. ? ? ?Current Outpatient Medications: ?ALPRAZolam (XANAX) 1 MG tablet, Take 1 mg by mouth daily as needed for anxiety., Disp: , Rfl:  ?betamethasone dipropionate 0.05 % cream, Apply 1 application. topically daily as needed (rash)., Disp: , Rfl:  ?buPROPion (WELLBUTRIN XL) 300 MG 24 hr tablet, Take 300 mg by mouth daily., Disp: , Rfl:  ?ciprofloxacin (CIPRO) 500 MG tablet, Take 1 tablet (500 mg total) by mouth 2 (two) times daily., Disp: 14 tablet, Rfl: 0 ?ketorolac (TORADOL) 10 MG tablet, Take 1 tablet (10 mg total) by mouth every 8 (eight) hours as needed., Disp: 15 tablet, Rfl: 0 ?Levomilnacipran HCl ER 80 MG CP24, Take 80 mg by mouth daily., Disp: , Rfl:  ? linaclotide (LINZESS) 72 MCG capsule, Take 145 mcg by mouth daily as needed (constipation)., Disp: , Rfl:  ?Liraglutide -Weight Management (SAXENDA [place]), Inject 0.6 mg into the skin every other day., Disp: , Rfl:  ?Nebivolol HCl 20 MG TABS, Take 20 mg by mouth daily., Disp: , Rfl: 0 ?omeprazole (PRILOSEC) 20 MG capsule, Take 20 mg by mouth at bedtime., Disp: , Rfl:  ?ondansetron (ZOFRAN-ODT) 8 MG disintegrating tablet, Take 1 tablet (8 mg total) by mouth every 8 (eight) hours as needed for nausea or vomiting., Disp: 8 tablet, Rfl: 0 ?oxyCODONE-acetaminophen (PERCOCET) 5-325 MG tablet, Take 1-2 tablets by mouth every 4 (four) hours as needed for severe pain., Disp: 30 tablet, Rfl: 0 ?SUMAtriptan (IMITREX) 100 MG tablet, Take 25 mg by mouth every 2 (two) hours as needed for migraine or headache. May repeat in 2 hours if headache persists or recurs., Disp: , Rfl:  ?topiramate (TOPAMAX) 50 MG tablet, Take 50 mg by mouth at bedtime., Disp: , Rfl:  ? ?No current facility-administered medications for this visit. ? ? ? ?There were no vitals taken for  this visit. ? ?Physical Exam: ?Normal post op exam ? ?Diagnostic Tests: ? ? ?Pathology: ?benign ? ?Impression + Management plan: ?  ICD-10-CM   ?1. S/P TVH 12/20/21  Z98.890   ?  ? ? ? ? ? ?Medications Prescribed this encounter: ?No orders of the defined types were placed in this encounter. ? ? ? ? ?Follow up: ?No follow-ups on file.  ? ? ?Florian Buff, MD ?Attending Physician for the Center for [facility] and ?[facility] ?01/01/2022 ?10:23 AM ? ? ? ? ?

## 2022-02-06 ENCOUNTER — Encounter: Payer: Self-pay | Admitting: Obstetrics & Gynecology

## 2022-02-06 ENCOUNTER — Ambulatory Visit (INDEPENDENT_AMBULATORY_CARE_PROVIDER_SITE_OTHER): Payer: PRIVATE HEALTH INSURANCE | Admitting: Obstetrics & Gynecology

## 2022-02-06 VITALS — BP 125/88 | HR 77 | Ht 64.0 in | Wt 142.0 lb

## 2022-02-06 DIAGNOSIS — Z9889 Other specified postprocedural states: Secondary | ICD-10-CM

## 2022-02-06 NOTE — Progress Notes (Signed)
  HPI: Patient returns for routine postoperative follow-up having undergone TVH on 12/20/21.  The patient's immediate postoperative recovery has been unremarkable. Since hospital discharge the patient reports no problems.   Current Outpatient Medications: ALPRAZolam (XANAX) 1 MG tablet, Take 1 mg by mouth daily as needed for anxiety., Disp: , Rfl:  betamethasone dipropionate 0.05 % cream, Apply 1 application. topically daily as needed (rash)., Disp: , Rfl:  buPROPion (WELLBUTRIN XL) 300 MG 24 hr tablet, Take 300 mg by mouth daily., Disp: , Rfl:  Levomilnacipran HCl ER 80 MG CP24, Take 80 mg by mouth daily., Disp: , Rfl:   linaclotide (LINZESS) 72 MCG capsule, Take 145 mcg by mouth daily as needed (constipation)., Disp: , Rfl:  Liraglutide -Weight Management (SAXENDA [place]), Inject 0.6 mg into the skin every other day., Disp: , Rfl:  Nebivolol HCl 20 MG TABS, Take 20 mg by mouth daily., Disp: , Rfl: 0 omeprazole (PRILOSEC) 20 MG capsule, Take 20 mg by mouth at bedtime., Disp: , Rfl:  SUMAtriptan (IMITREX) 100 MG tablet, Take 25 mg by mouth every 2 (two) hours as needed for migraine or headache. May repeat in 2 hours if headache persists or recurs., Disp: , Rfl:  topiramate (TOPAMAX) 50 MG tablet, Take 50 mg by mouth at bedtime., Disp: , Rfl:   No current facility-administered medications for this visit.    Blood pressure 125/88, pulse 77, height 5' 4"  (1.626 m), weight 142 lb (64.4 kg).  Physical Exam: Vagina is well healed no masses or tenderness Bimanual is normal  Diagnostic Tests:   Pathology: benign  Impression + Management plan:   ICD-10-CM   1. S/P [facility] 12/20/21  Z98.890          Medications Prescribed this encounter: No orders of the defined types were placed in this encounter.     Follow up: prn   Florian Buff, MD Attending Physician for the Center for [facility] 02/06/2022 3:11 PM

## 2022-10-08 ENCOUNTER — Other Ambulatory Visit: Payer: Self-pay | Admitting: Adult Health Nurse Practitioner

## 2022-10-08 DIAGNOSIS — Z1231 Encounter for screening mammogram for malignant neoplasm of breast: Secondary | ICD-10-CM

## 2023-08-12 IMAGING — MG DIGITAL DIAGNOSTIC BILAT W/ TOMO W/ CAD
6 of 12 series · 6 of 36 positions shown · non-contrast
Comparison: Previous exam(s).

CLINICAL DATA: The patient palpated a recent lump in the left
breast which has resolved.

EXAM:
DIGITAL DIAGNOSTIC BILATERAL MAMMOGRAM WITH TOMOSYNTHESIS AND CAD;
ULTRASOUND LEFT BREAST LIMITED
TECHNIQUE: Bilateral digital diagnostic mammography and breast tomosynthesis
was performed. The images were evaluated with computer-aided
detection.; Targeted ultrasound examination of the left breast was
performed.

[L MLO synth-2D (1 of 2)]
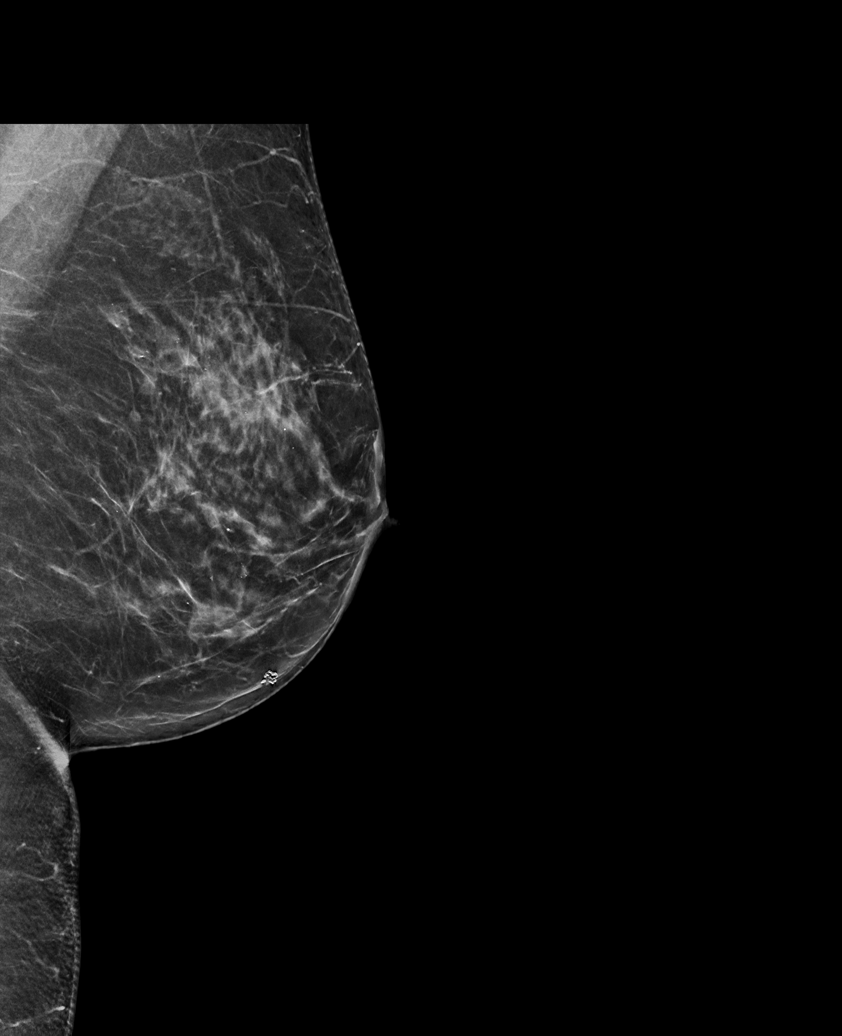

[L MLO synth-2D (2 of 2)]
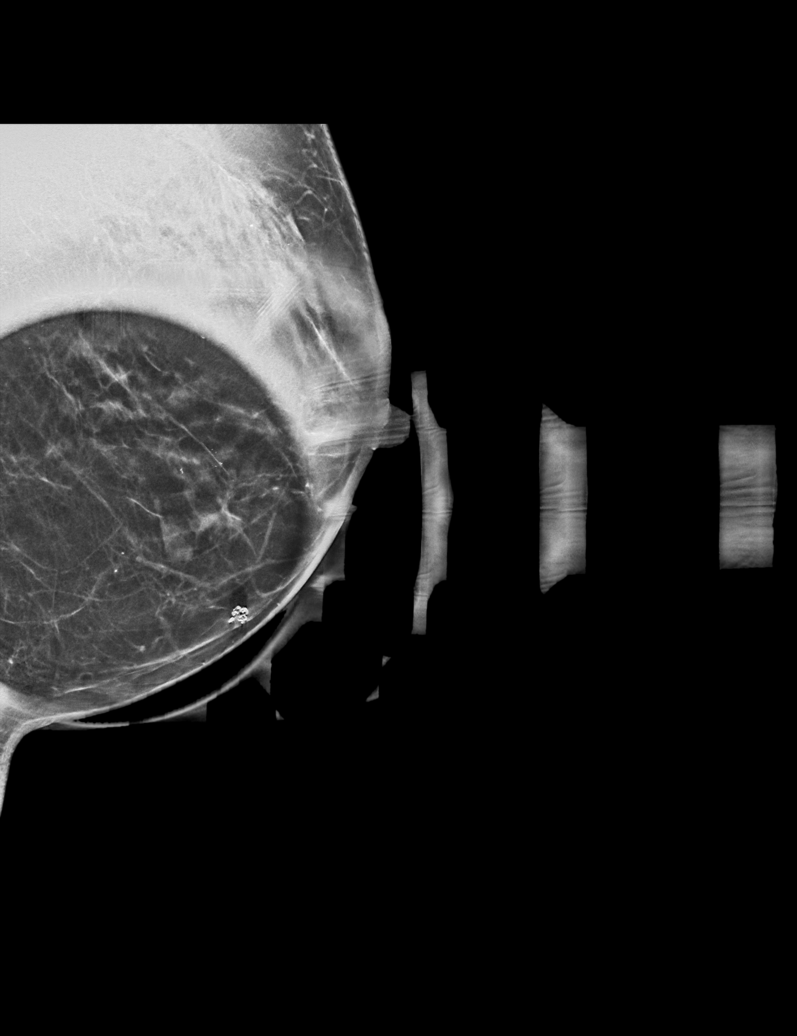

[R CC synth-2D]
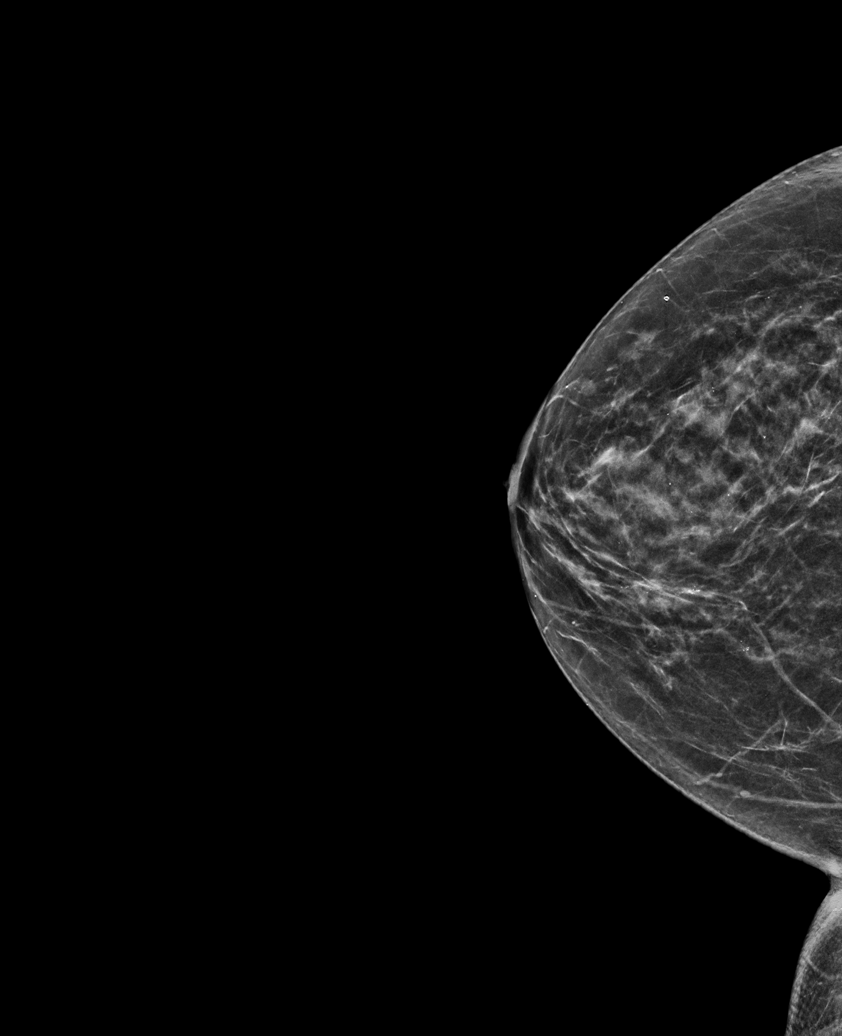

[R MLO synth-2D]
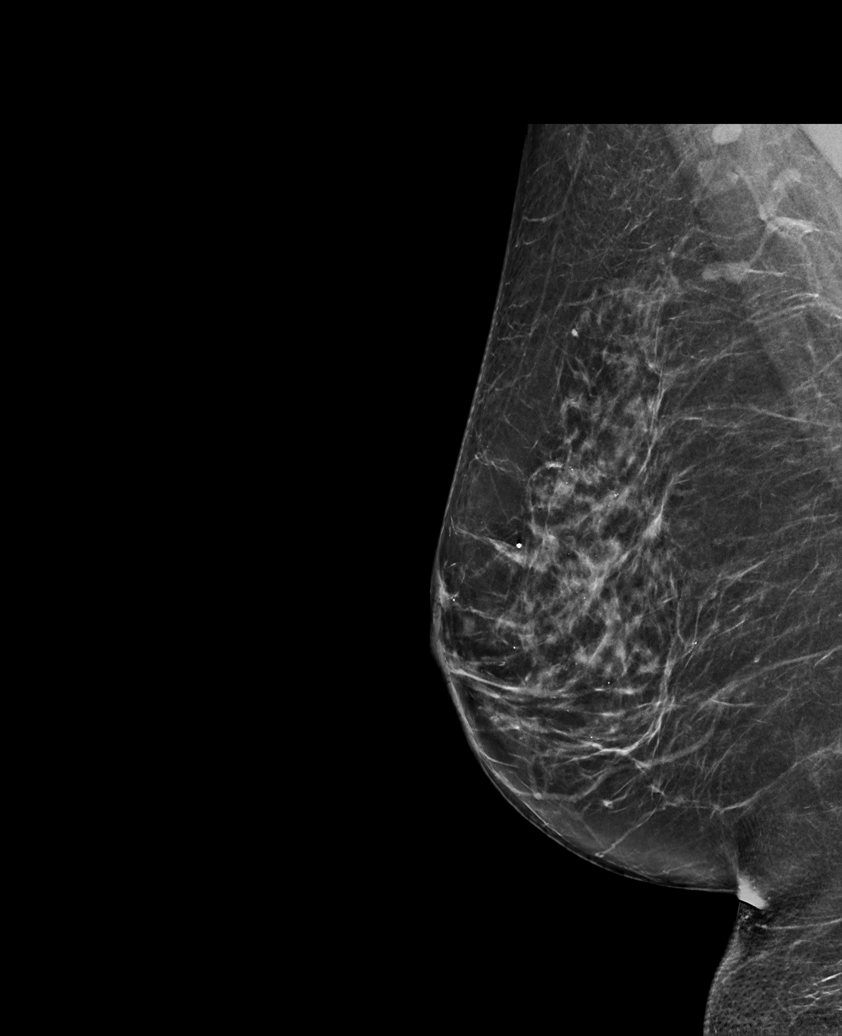

[L CC synth-2D (1 of 2)]
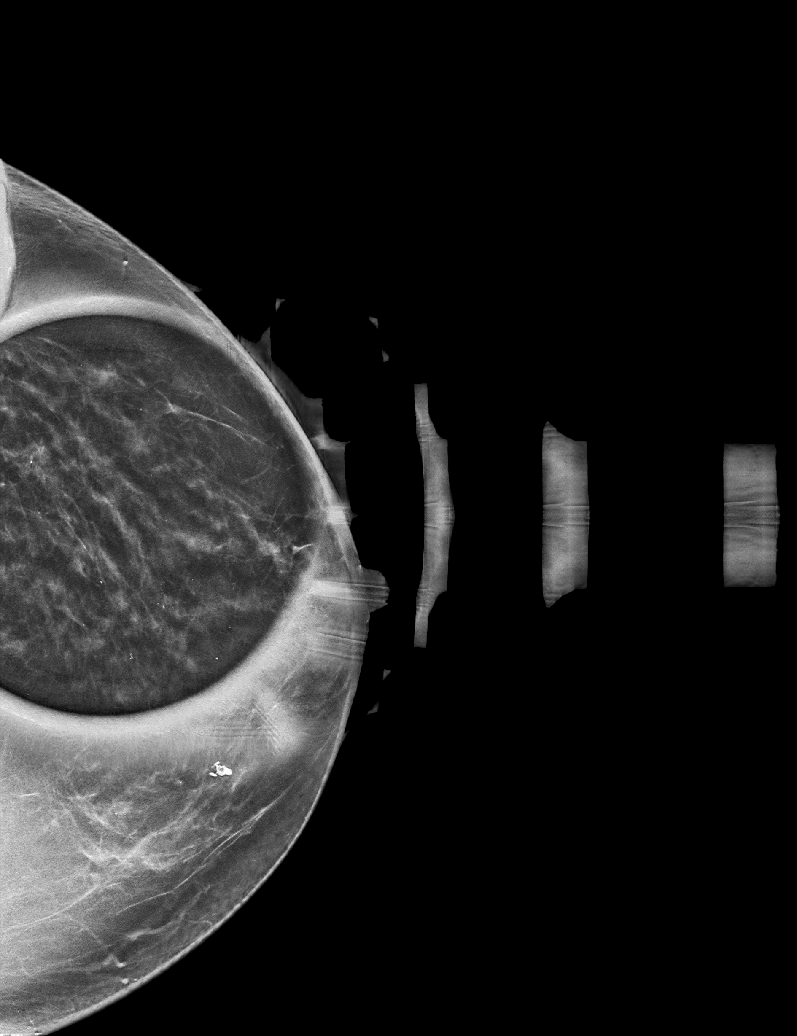

[L CC synth-2D (2 of 2)]
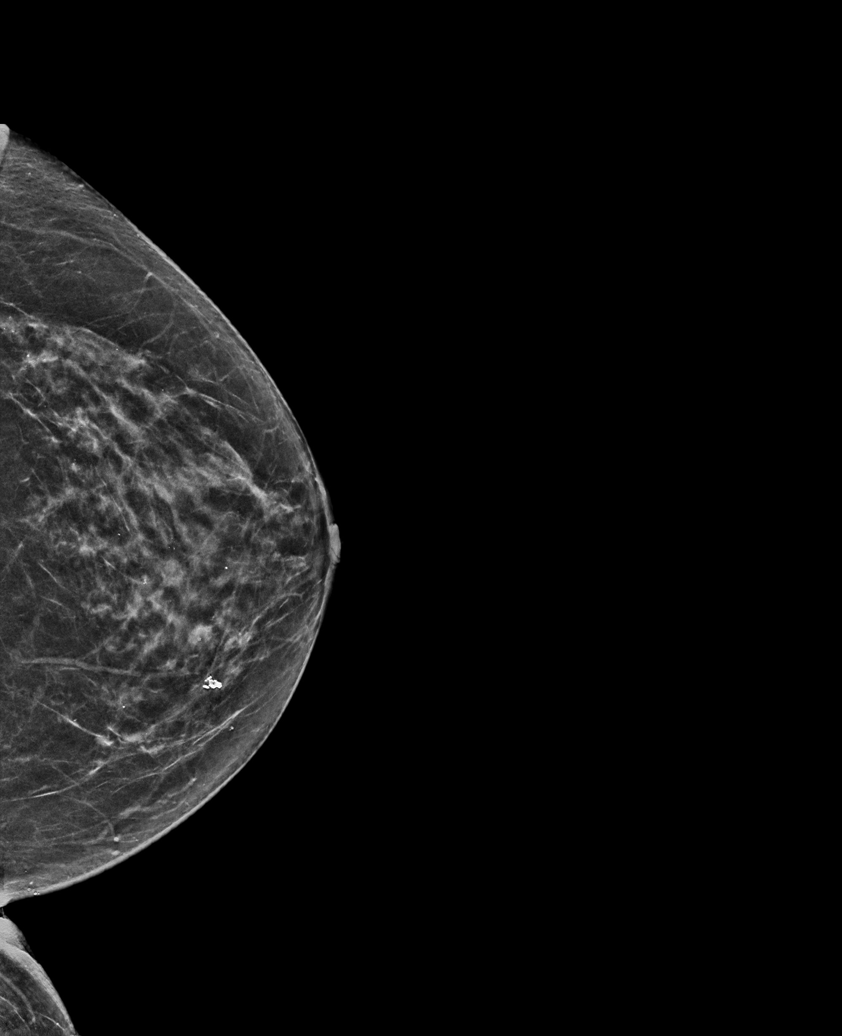

[6 of 36 positions shown; findings below may reference images not displayed]

ACR Breast Density Category b: There are scattered areas of
fibroglandular density.
FINDINGS: There is an asymmetry in the inferior left breast which is not
completely resolve on additional imaging. No other suspicious
changes are seen in either breast.

Targeted ultrasound is performed, showing fibrocystic changes in the
inferior left breast accounting for the left breast asymmetry. No
evidence of malignancy.
IMPRESSION: No mammographic or sonographic evidence of malignancy.

RECOMMENDATION:
Annual screening mammography.

I have discussed the findings and recommendations with the patient.
If applicable, a reminder letter will be sent to the patient
regarding the next appointment.

BI-RADS CATEGORY  2: Benign.

## 2023-10-07 ENCOUNTER — Emergency Department (HOSPITAL_COMMUNITY): Payer: PRIVATE HEALTH INSURANCE

## 2023-10-07 ENCOUNTER — Encounter (HOSPITAL_COMMUNITY): Payer: Self-pay

## 2023-10-07 ENCOUNTER — Other Ambulatory Visit: Payer: Self-pay

## 2023-10-07 ENCOUNTER — Emergency Department (HOSPITAL_COMMUNITY)
Admission: EM | Admit: 2023-10-07 | Discharge: 2023-10-07 | Disposition: A | Discharge status: Home / Self Care | Payer: PRIVATE HEALTH INSURANCE | Attending: Emergency Medicine | Admitting: Emergency Medicine

## 2023-10-07 DIAGNOSIS — I10 Essential (primary) hypertension: Secondary | ICD-10-CM | POA: Insufficient documentation

## 2023-10-07 DIAGNOSIS — Z79899 Other long term (current) drug therapy: Secondary | ICD-10-CM | POA: Diagnosis not present

## 2023-10-07 DIAGNOSIS — N200 Calculus of kidney: Secondary | ICD-10-CM | POA: Insufficient documentation

## 2023-10-07 DIAGNOSIS — R109 Unspecified abdominal pain: Secondary | ICD-10-CM | POA: Diagnosis present

## 2023-10-07 DIAGNOSIS — R319 Hematuria, unspecified: Secondary | ICD-10-CM

## 2023-10-07 LAB — CBC WITH DIFFERENTIAL/PLATELET
Abs Immature Granulocytes: 0.02 10*3/uL (ref 0.00–0.07)
Basophils Absolute: 0.1 10*3/uL (ref 0.0–0.1)
Basophils Relative: 2 %
Eosinophils Absolute: 0.2 10*3/uL (ref 0.0–0.5)
Eosinophils Relative: 3 %
HCT: 44.7 % (ref 36.0–46.0)
Hemoglobin: 14.6 g/dL (ref 12.0–15.0)
Immature Granulocytes: 0 %
Lymphocytes Relative: 31 %
Lymphs Abs: 1.6 10*3/uL (ref 0.7–4.0)
MCH: 30.1 pg (ref 26.0–34.0)
MCHC: 32.7 g/dL (ref 30.0–36.0)
MCV: 92.2 fL (ref 80.0–100.0)
Monocytes Absolute: 0.3 10*3/uL (ref 0.1–1.0)
Monocytes Relative: 6 %
Neutro Abs: 3.1 10*3/uL (ref 1.7–7.7)
Neutrophils Relative %: 58 %
Platelets: 219 10*3/uL (ref 150–400)
RBC: 4.85 MIL/uL (ref 3.87–5.11)
RDW: 12.9 % (ref 11.5–15.5)
WBC: 5.3 10*3/uL (ref 4.0–10.5)
nRBC: 0 % (ref 0.0–0.2)

## 2023-10-07 LAB — COMPREHENSIVE METABOLIC PANEL
ALT: 15 U/L (ref 0–44)
AST: 15 U/L (ref 15–41)
Albumin: 4 g/dL (ref 3.5–5.0)
Alkaline Phosphatase: 63 U/L (ref 38–126)
Anion gap: 6 (ref 5–15)
BUN: 19 mg/dL (ref 6–20)
CO2: 22 mmol/L (ref 22–32)
Calcium: 9.2 mg/dL (ref 8.9–10.3)
Chloride: 110 mmol/L (ref 98–111)
Creatinine, Ser: 1.01 mg/dL — ABNORMAL HIGH (ref 0.44–1.00)
GFR, Estimated: >60 mL/min (ref >60)
Glucose, Bld: 85 mg/dL (ref 70–99)
Potassium: 3.8 mmol/L (ref 3.5–5.1)
Sodium: 138 mmol/L (ref 135–145)
Total Bilirubin: 0.7 mg/dL (ref 0.0–1.2)
Total Protein: 7.1 g/dL (ref 6.5–8.1)

## 2023-10-07 LAB — URINALYSIS, ROUTINE W REFLEX MICROSCOPIC
Bacteria, UA: NONE SEEN
Bilirubin Urine: NEGATIVE
Glucose, UA: NEGATIVE mg/dL
Ketones, ur: NEGATIVE mg/dL
Leukocytes,Ua: NEGATIVE
Nitrite: NEGATIVE
Protein, ur: NEGATIVE mg/dL
RBC / HPF: >50 RBC/hpf (ref 0–5)
Specific Gravity, Urine: 1.027 (ref 1.005–1.030)
pH: 5 (ref 5.0–8.0)

## 2023-10-07 NOTE — ED Triage Notes (Signed)
Pt arrived via POV c/o left flank pain. Pt also endorses sciatic pain on left side. Pt reports difficulty micturating.

## 2023-10-07 NOTE — ED Provider Notes (Signed)
[facility] EMERGENC[facility] DEPARTMENT AT [facility] Provider Note   CSN: [id number] Arrival date & time: 10/07/23  1014     History  Chief Complaint  Patient presents with   Flank Pain    Erica Cox is a 53 y.o. female.   Flank Pain  Patient with recent left sided sciatica.  Now with suprapubic pain and some dysuria.  Feels if she has to go to the bathroom.  Pain was also severe in the right flank.  Feels like previous kidney stones.  Pain improved somewhat now.  No fevers.    Past Medical History:  Diagnosis Date   Anxiety    Breast nodule 12/14/2013   Will get diagnostic mammogram and right [place] if needed   Contraceptive management 11/11/2015   Cough 12/28/2015   Depression    Eversion of cervix 12/28/2015   Headache(784.0)    migraines   Hypertension 11/11/2015   Macromastia    Pollen allergies 12/28/2015   PONV (postoperative nausea and vomiting)    Positive colorectal cancer screening using Cologuard test 07/28/2021   07/28/21 Will refer for colonoscopy, with Dr Karilyn Cota   Postcoital bleeding 12/28/2015   Sciatic pain    SUI (stress urinary incontinence, female) 12/14/2013   Vaginal Pap smear, abnormal     Home Medications Prior to Admission medications   Medication Sig Start Date End Date Taking? Authorizing Provider  acetaminophen (TYLENOL) 325 MG tablet Take 650 mg by mouth every 6 (six) hours as needed for moderate pain (pain score 4-6).   Yes [provider]  AIMOVIG 140 MG/ML SOAJ Inject into the skin. 03/19/22  Yes [provider]  ALPRAZolam Prudy Feeler) 1 MG tablet Take 1 mg by mouth daily as needed for anxiety.   Yes [provider]  ARIPiprazole (ABILIFY) 2 MG tablet Take by mouth. 04/05/23  Yes [provider]  buPROPion (WELLBUTRIN XL) 300 MG 24 hr tablet Take 300 mg by mouth daily.   Yes [provider]  Levomilnacipran HCl ER (FETZIMA) 80 MG CP24 Take 1 capsule by mouth daily.   Yes [provider]  linaclotide (LINZESS) 145 MCG CAPS capsule Take by mouth. 04/25/21  Yes [provider]  Nebivolol HCl 20 MG TABS Take 20 mg by mouth daily. 04/23/18  Yes [provider]  omeprazole (PRILOSEC) 20 MG capsule Take 20 mg by mouth at bedtime.   Yes [provider]  phentermine (ADIPEX-P) 37.5 MG tablet Take by mouth. 10/05/22  Yes [provider]  SUMAtriptan (IMITREX) 100 MG tablet Take 25 mg by mouth every 2 (two) hours as needed for migraine or headache. May repeat in 2 hours if headache persists or recurs.   Yes [provider]  topiramate (TOPAMAX) 50 MG tablet Take 50 mg by mouth at bedtime.   Yes [provider]  betamethasone dipropionate 0.05 % cream Apply 1 application. topically daily as needed (rash).    [provider]      Allergies    Adhesive [tape], Amoxicillin, and Other    Review of Systems   Review of Systems  Genitourinary:  Positive for flank pain.    Physical Exam Updated Vital Signs BP (!) 151/110 (BP Location: Right Arm)   Pulse 94   Temp 98.4 F (36.9 C) (Oral)   Resp 18   Ht 5\' 4"  (1.626 m)   Wt 64.4 kg   LMP  (LMP Unknown)   SpO2 99%   BMI 24.37 kg/m  Physical  Exam Vitals and nursing note reviewed.  Abdominal:     Tenderness: There is abdominal tenderness.     Comments: Suprapubic tenderness.  Genitourinary:    Comments: No CVA tenderness. Skin:    General: Skin is warm.  Neurological:     Mental Status: She is alert and oriented to person, place, and time.     ED Results / Procedures / Treatments   Labs (all labs ordered are listed, but only abnormal results are displayed) Labs Reviewed  URINALYSIS, ROUTINE W REFLEX MICROSCOPIC - Abnormal; Notable for the following components:      Result Value   Hgb urine dipstick MODERATE (*)    All other components within normal limits  COMPREHENSIVE METABOLIC PANEL - Abnormal; Notable for the following components:   Creatinine, Ser 1.01  (*)    All other components within normal limits  CBC WITH DIFFERENTIAL/PLATELET    EKG None  Radiology CT Renal Stone Study Result Date: 10/07/2023 CLINICAL DATA:  Abdominal/flank pain, stone suspected EXAM: CT ABDOMEN AND PELVIS WITHOUT CONTRAST TECHNIQUE: Multidetector CT imaging of the abdomen and pelvis was performed following the standard protocol without IV contrast. RADIATION DOSE REDUCTION: This exam was performed according to the departmental dose-optimization program which includes automated exposure control, adjustment of the mA and/or kV according to patient size and/or use of iterative reconstruction technique. COMPARISON:  CT scan renal stone protocol from 01/06/2016. FINDINGS: Lower chest: There is a partially seen small grouping of tree-in-bud configuration nodules in the left lung lower lobe, nonspecific but likely sequela of infection or inflammation. The lung bases are otherwise clear. No pleural effusion. The heart is normal in size. No pericardial effusion. Hepatobiliary: The liver is normal in size. Non-cirrhotic configuration. No suspicious mass. No intrahepatic or extrahepatic bile duct dilation. Gallbladder is surgically absent. Pancreas: Unremarkable. No pancreatic ductal dilatation or surrounding inflammatory changes. Spleen: Within normal limits. No focal lesion. Adrenals/Urinary Tract: Adrenal glands are unremarkable. No suspicious renal mass within the limitations of this unenhanced exam. There is a 3 mm nonobstructing calculus in the right kidney lower pole. No other nephroureterolithiasis on either side. Urinary bladder is under distended, precluding optimal assessment. However, no large mass or stones identified. No perivesical fat stranding. Stomach/Bowel: No disproportionate dilation of the small or large bowel loops. No evidence of abnormal bowel wall thickening or inflammatory changes. The appendix is unremarkable. There are at least 2 small diverticula arising from  the distal duodenum. Vascular/Lymphatic: No ascites or pneumoperitoneum. No abdominal or pelvic lymphadenopathy, by size criteria. No aneurysmal dilation of the major abdominal arteries. Reproductive: The uterus is surgically absent. No large adnexal mass. Other: The visualized soft tissues and abdominal wall are unremarkable. Musculoskeletal: No suspicious osseous lesions. IMPRESSION: 1. There is a 3 mm nonobstructing calculus in the right kidney lower pole. No other nephroureterolithiasis or obstructive uropathy. 2. No acute inflammatory process identified within the abdomen or pelvis. 3. Multiple other nonacute observations, as described above. Electronically Signed   By: Jules Schick M.D.   On: 10/07/2023 13:08    Procedures Procedures    Medications Ordered in ED Medications - No data to display  ED Course/ Medical Decision Making/ A&P                                 Medical Decision Making Amount and/or Complexity of Data Reviewed Labs: ordered. Radiology: ordered.   Patient with flank pain.  Right-sided.  Differential  diagnose includes causes such as UTI and kidney stone.  Has had previous kidney stone.  Reviewed previous CT scan from 2017.  Will get urinalysis and stone protocol CT.  States pain is much improved.  Urine does have some hematuria.  CT scan shows renal stone but not ureteral stone.  Workup otherwise reassuring.  Pain continues to be relieved.  Will discharge home.  With urology follow-up for her hematuria and stone.       Final Clinical Impression(s) / ED Diagnoses Final diagnoses:  Flank pain  Hematuria, unspecified type    Rx / DC Orders ED Discharge Orders     None         Benjiman Core, MD 10/07/23 1407

## 2023-10-07 NOTE — Discharge Instructions (Signed)
You have a kidney stone up in the right kidney.  Follow-up with your Urologist as needed.
